# Patient Record
Sex: Female | Born: 1950 | Race: Black or African American | Hispanic: No | Marital: Married | Smoking: Never smoker
Health system: Southern US, Community
[De-identification: ages and names within clinical notes are randomized; demographics above are authoritative.]

## PROBLEM LIST (undated history)

## (undated) DIAGNOSIS — M5030 Other cervical disc degeneration, unspecified cervical region: Secondary | ICD-10-CM

## (undated) DIAGNOSIS — M545 Low back pain, unspecified: Secondary | ICD-10-CM

## (undated) DIAGNOSIS — T4145XA Adverse effect of unspecified anesthetic, initial encounter: Secondary | ICD-10-CM

## (undated) DIAGNOSIS — F419 Anxiety disorder, unspecified: Secondary | ICD-10-CM

## (undated) DIAGNOSIS — T8859XA Other complications of anesthesia, initial encounter: Secondary | ICD-10-CM

## (undated) DIAGNOSIS — R112 Nausea with vomiting, unspecified: Secondary | ICD-10-CM

## (undated) DIAGNOSIS — I10 Essential (primary) hypertension: Secondary | ICD-10-CM

## (undated) DIAGNOSIS — F329 Major depressive disorder, single episode, unspecified: Secondary | ICD-10-CM

## (undated) DIAGNOSIS — M25572 Pain in left ankle and joints of left foot: Secondary | ICD-10-CM

## (undated) DIAGNOSIS — Z9889 Other specified postprocedural states: Secondary | ICD-10-CM

## (undated) DIAGNOSIS — F32A Depression, unspecified: Secondary | ICD-10-CM

## (undated) DIAGNOSIS — M549 Dorsalgia, unspecified: Secondary | ICD-10-CM

## (undated) DIAGNOSIS — M25569 Pain in unspecified knee: Secondary | ICD-10-CM

## (undated) DIAGNOSIS — G629 Polyneuropathy, unspecified: Secondary | ICD-10-CM

## (undated) DIAGNOSIS — G8929 Other chronic pain: Secondary | ICD-10-CM

## (undated) DIAGNOSIS — M79602 Pain in left arm: Secondary | ICD-10-CM

## (undated) DIAGNOSIS — R011 Cardiac murmur, unspecified: Secondary | ICD-10-CM

## (undated) DIAGNOSIS — M1990 Unspecified osteoarthritis, unspecified site: Secondary | ICD-10-CM

## (undated) DIAGNOSIS — M5410 Radiculopathy, site unspecified: Secondary | ICD-10-CM

## (undated) HISTORY — DX: Pain in left arm: M79.602

## (undated) HISTORY — PX: ABDOMINAL HYSTERECTOMY: SHX81

## (undated) HISTORY — DX: Pain in unspecified knee: M25.569

## (undated) HISTORY — DX: Pain in left ankle and joints of left foot: M25.572

## (undated) HISTORY — PX: UTERINE FIBROID SURGERY: SHX826

## (undated) HISTORY — DX: Dorsalgia, unspecified: M54.9

## (undated) HISTORY — PX: OTHER SURGICAL HISTORY: SHX169

## (undated) HISTORY — PX: TONSILECTOMY, ADENOIDECTOMY, BILATERAL MYRINGOTOMY AND TUBES: SHX2538

---

## 1997-09-13 ENCOUNTER — Other Ambulatory Visit: Admission: RE | Admit: 1997-09-13 | Discharge: 1997-09-13 | Payer: Self-pay | Admitting: Obstetrics and Gynecology

## 2001-11-16 ENCOUNTER — Encounter: Payer: Self-pay | Admitting: Family Medicine

## 2001-11-16 ENCOUNTER — Ambulatory Visit (HOSPITAL_COMMUNITY): Admission: RE | Admit: 2001-11-16 | Discharge: 2001-11-16 | Payer: Self-pay | Admitting: Family Medicine

## 2003-04-13 ENCOUNTER — Ambulatory Visit (HOSPITAL_COMMUNITY): Admission: RE | Admit: 2003-04-13 | Discharge: 2003-04-13 | Payer: Self-pay | Admitting: Family Medicine

## 2003-04-19 ENCOUNTER — Ambulatory Visit (HOSPITAL_COMMUNITY): Admission: RE | Admit: 2003-04-19 | Discharge: 2003-04-19 | Payer: Self-pay | Admitting: Family Medicine

## 2003-07-31 ENCOUNTER — Ambulatory Visit (HOSPITAL_COMMUNITY): Admission: RE | Admit: 2003-07-31 | Discharge: 2003-07-31 | Payer: Self-pay | Admitting: Obstetrics and Gynecology

## 2003-12-01 ENCOUNTER — Emergency Department (HOSPITAL_COMMUNITY): Admission: EM | Admit: 2003-12-01 | Discharge: 2003-12-01 | Payer: Self-pay | Admitting: Emergency Medicine

## 2003-12-14 ENCOUNTER — Encounter (HOSPITAL_COMMUNITY): Admission: RE | Admit: 2003-12-14 | Discharge: 2004-01-13 | Payer: Self-pay | Admitting: Orthopedic Surgery

## 2005-04-09 ENCOUNTER — Ambulatory Visit: Payer: Self-pay | Admitting: Orthopedic Surgery

## 2005-08-07 ENCOUNTER — Ambulatory Visit (HOSPITAL_COMMUNITY): Admission: RE | Admit: 2005-08-07 | Discharge: 2005-08-07 | Payer: Self-pay | Admitting: Emergency Medicine

## 2006-07-20 ENCOUNTER — Ambulatory Visit: Payer: Self-pay | Admitting: Orthopedic Surgery

## 2006-09-10 ENCOUNTER — Ambulatory Visit: Payer: Self-pay | Admitting: Orthopedic Surgery

## 2006-09-17 ENCOUNTER — Ambulatory Visit: Payer: Self-pay | Admitting: Orthopedic Surgery

## 2006-09-18 ENCOUNTER — Ambulatory Visit (HOSPITAL_COMMUNITY): Admission: RE | Admit: 2006-09-18 | Discharge: 2006-09-18 | Payer: Self-pay | Admitting: Internal Medicine

## 2008-01-11 ENCOUNTER — Ambulatory Visit (HOSPITAL_COMMUNITY): Admission: RE | Admit: 2008-01-11 | Discharge: 2008-01-11 | Payer: Self-pay | Admitting: Internal Medicine

## 2009-02-27 ENCOUNTER — Ambulatory Visit (HOSPITAL_COMMUNITY): Admission: RE | Admit: 2009-02-27 | Discharge: 2009-02-27 | Payer: Self-pay | Admitting: Internal Medicine

## 2009-08-14 ENCOUNTER — Ambulatory Visit (HOSPITAL_COMMUNITY): Admission: RE | Admit: 2009-08-14 | Discharge: 2009-08-14 | Payer: Self-pay | Admitting: Family Medicine

## 2009-11-14 ENCOUNTER — Ambulatory Visit (HOSPITAL_COMMUNITY): Admission: RE | Admit: 2009-11-14 | Discharge: 2009-11-14 | Payer: Self-pay | Admitting: Family Medicine

## 2009-12-11 ENCOUNTER — Ambulatory Visit (HOSPITAL_COMMUNITY): Admission: RE | Admit: 2009-12-11 | Discharge: 2009-12-11 | Payer: Self-pay | Admitting: Obstetrics and Gynecology

## 2010-01-15 ENCOUNTER — Inpatient Hospital Stay (HOSPITAL_COMMUNITY): Admission: RE | Admit: 2010-01-15 | Discharge: 2010-01-17 | Payer: Self-pay | Admitting: Obstetrics and Gynecology

## 2010-01-15 ENCOUNTER — Encounter: Payer: Self-pay | Admitting: Obstetrics and Gynecology

## 2010-05-29 ENCOUNTER — Other Ambulatory Visit (HOSPITAL_COMMUNITY): Payer: Self-pay | Admitting: Family Medicine

## 2010-05-29 DIAGNOSIS — Z139 Encounter for screening, unspecified: Secondary | ICD-10-CM

## 2010-06-06 ENCOUNTER — Ambulatory Visit (HOSPITAL_COMMUNITY)
Admission: RE | Admit: 2010-06-06 | Discharge: 2010-06-06 | Disposition: A | Discharge status: Home / Self Care | Payer: Medicaid Other | Source: Ambulatory Visit | Attending: Family Medicine | Admitting: Family Medicine

## 2010-06-06 ENCOUNTER — Encounter (HOSPITAL_COMMUNITY): Payer: Self-pay

## 2010-06-06 DIAGNOSIS — Z139 Encounter for screening, unspecified: Secondary | ICD-10-CM

## 2010-06-06 DIAGNOSIS — Z1231 Encounter for screening mammogram for malignant neoplasm of breast: Secondary | ICD-10-CM | POA: Insufficient documentation

## 2010-07-11 ENCOUNTER — Other Ambulatory Visit: Payer: Self-pay | Admitting: Obstetrics and Gynecology

## 2010-07-11 DIAGNOSIS — R1031 Right lower quadrant pain: Secondary | ICD-10-CM

## 2010-07-15 ENCOUNTER — Ambulatory Visit (HOSPITAL_COMMUNITY)
Admission: RE | Admit: 2010-07-15 | Discharge: 2010-07-15 | Disposition: A | Discharge status: Home / Self Care | Payer: Medicaid Other | Source: Ambulatory Visit | Attending: Obstetrics and Gynecology | Admitting: Obstetrics and Gynecology

## 2010-07-15 DIAGNOSIS — R1031 Right lower quadrant pain: Secondary | ICD-10-CM | POA: Insufficient documentation

## 2010-07-15 DIAGNOSIS — N9489 Other specified conditions associated with female genital organs and menstrual cycle: Secondary | ICD-10-CM | POA: Insufficient documentation

## 2010-07-18 LAB — URINALYSIS, ROUTINE W REFLEX MICROSCOPIC
Bilirubin Urine: NEGATIVE
Glucose, UA: NEGATIVE mg/dL
Hgb urine dipstick: NEGATIVE
Ketones, ur: NEGATIVE mg/dL
Nitrite: NEGATIVE
Protein, ur: NEGATIVE mg/dL
Specific Gravity, Urine: 1.015 (ref 1.005–1.030)
Urobilinogen, UA: 0.2 mg/dL (ref 0.0–1.0)
pH: 7.5 (ref 5.0–8.0)

## 2010-07-18 LAB — BASIC METABOLIC PANEL
BUN: 10 mg/dL (ref 6–23)
CO2: 28 mEq/L (ref 19–32)
Calcium: 8.4 mg/dL (ref 8.4–10.5)
Chloride: 101 mEq/L (ref 96–112)
Creatinine, Ser: 1.6 mg/dL — ABNORMAL HIGH (ref 0.4–1.2)
GFR calc Af Amer: 40 mL/min — ABNORMAL LOW (ref >60)
GFR calc non Af Amer: 33 mL/min — ABNORMAL LOW (ref >60)
Glucose, Bld: 117 mg/dL — ABNORMAL HIGH (ref 70–99)
Potassium: 4.7 mEq/L (ref 3.5–5.1)
Sodium: 135 mEq/L (ref 135–145)

## 2010-07-18 LAB — CBC
HCT: 37.4 % (ref 36.0–46.0)
HCT: 39.4 % (ref 36.0–46.0)
Hemoglobin: 12.2 g/dL (ref 12.0–15.0)
Hemoglobin: 13 g/dL (ref 12.0–15.0)
MCH: 27.1 pg (ref 26.0–34.0)
MCH: 27.4 pg (ref 26.0–34.0)
MCHC: 32.5 g/dL (ref 30.0–36.0)
MCHC: 33.1 g/dL (ref 30.0–36.0)
MCV: 82.8 fL (ref 78.0–100.0)
MCV: 83.5 fL (ref 78.0–100.0)
Platelets: 243 10*3/uL (ref 150–400)
Platelets: 277 10*3/uL (ref 150–400)
RBC: 4.48 MIL/uL (ref 3.87–5.11)
RBC: 4.76 MIL/uL (ref 3.87–5.11)
RDW: 13.4 % (ref 11.5–15.5)
RDW: 14.2 % (ref 11.5–15.5)
WBC: 11 10*3/uL — ABNORMAL HIGH (ref 4.0–10.5)
WBC: 7.7 10*3/uL (ref 4.0–10.5)

## 2010-07-18 LAB — COMPREHENSIVE METABOLIC PANEL
ALT: 13 U/L (ref 0–35)
AST: 20 U/L (ref 0–37)
Albumin: 3.4 g/dL — ABNORMAL LOW (ref 3.5–5.2)
Alkaline Phosphatase: 77 U/L (ref 39–117)
BUN: 12 mg/dL (ref 6–23)
CO2: 27 mEq/L (ref 19–32)
Calcium: 8.9 mg/dL (ref 8.4–10.5)
Chloride: 105 mEq/L (ref 96–112)
Creatinine, Ser: 1.05 mg/dL (ref 0.4–1.2)
GFR calc Af Amer: >60 mL/min (ref >60)
GFR calc non Af Amer: 53 mL/min — ABNORMAL LOW (ref >60)
Glucose, Bld: 108 mg/dL — ABNORMAL HIGH (ref 70–99)
Potassium: 3.6 mEq/L (ref 3.5–5.1)
Sodium: 139 mEq/L (ref 135–145)
Total Bilirubin: 0.5 mg/dL (ref 0.3–1.2)
Total Protein: 6.9 g/dL (ref 6.0–8.3)

## 2010-07-18 LAB — SURGICAL PCR SCREEN
MRSA, PCR: NEGATIVE
Staphylococcus aureus: NEGATIVE

## 2010-07-18 LAB — DIFFERENTIAL
Basophils Absolute: 0 10*3/uL (ref 0.0–0.1)
Basophils Relative: 0 % (ref 0–1)
Eosinophils Absolute: 0.1 10*3/uL (ref 0.0–0.7)
Eosinophils Relative: 1 % (ref 0–5)
Lymphocytes Relative: 11 % — ABNORMAL LOW (ref 12–46)
Lymphs Abs: 1.2 10*3/uL (ref 0.7–4.0)
Monocytes Absolute: 0.7 10*3/uL (ref 0.1–1.0)
Monocytes Relative: 7 % (ref 3–12)
Neutro Abs: 9 10*3/uL — ABNORMAL HIGH (ref 1.7–7.7)
Neutrophils Relative %: 81 % — ABNORMAL HIGH (ref 43–77)

## 2010-09-09 ENCOUNTER — Emergency Department (HOSPITAL_COMMUNITY): Payer: Medicaid Other

## 2010-09-09 ENCOUNTER — Emergency Department (HOSPITAL_COMMUNITY)
Admission: EM | Admit: 2010-09-09 | Discharge: 2010-09-10 | Disposition: A | Discharge status: Home / Self Care | Payer: Medicaid Other | Attending: Emergency Medicine | Admitting: Emergency Medicine

## 2010-09-09 DIAGNOSIS — I10 Essential (primary) hypertension: Secondary | ICD-10-CM | POA: Insufficient documentation

## 2010-09-09 DIAGNOSIS — R110 Nausea: Secondary | ICD-10-CM | POA: Insufficient documentation

## 2010-09-09 DIAGNOSIS — M545 Low back pain, unspecified: Secondary | ICD-10-CM | POA: Insufficient documentation

## 2010-09-09 DIAGNOSIS — R42 Dizziness and giddiness: Secondary | ICD-10-CM | POA: Insufficient documentation

## 2010-09-09 LAB — BASIC METABOLIC PANEL
CO2: 27 mEq/L (ref 19–32)
Calcium: 10.2 mg/dL (ref 8.4–10.5)
Chloride: 100 mEq/L (ref 96–112)
Creatinine, Ser: 0.96 mg/dL (ref 0.4–1.2)
GFR calc Af Amer: >60 mL/min (ref >60)
Sodium: 139 mEq/L (ref 135–145)

## 2010-09-09 LAB — CBC
Hemoglobin: 12.8 g/dL (ref 12.0–15.0)
MCHC: 32.5 g/dL (ref 30.0–36.0)
Platelets: 324 10*3/uL (ref 150–400)
RBC: 4.78 MIL/uL (ref 3.87–5.11)

## 2010-09-09 LAB — POCT CARDIAC MARKERS: Troponin i, poc: <0.05 ng/mL (ref 0.00–0.09)

## 2010-09-09 LAB — DIFFERENTIAL
Basophils Absolute: 0.1 10*3/uL (ref 0.0–0.1)
Basophils Relative: 1 % (ref 0–1)
Eosinophils Absolute: 0.1 10*3/uL (ref 0.0–0.7)
Lymphocytes Relative: 30 % (ref 12–46)

## 2010-09-10 LAB — URINALYSIS, ROUTINE W REFLEX MICROSCOPIC
Hgb urine dipstick: NEGATIVE
Nitrite: NEGATIVE
Protein, ur: NEGATIVE mg/dL
Specific Gravity, Urine: >1.03 — ABNORMAL HIGH (ref 1.005–1.030)
Urobilinogen, UA: 0.2 mg/dL (ref 0.0–1.0)

## 2010-09-11 ENCOUNTER — Other Ambulatory Visit (HOSPITAL_COMMUNITY): Payer: Self-pay | Admitting: Family Medicine

## 2010-09-11 ENCOUNTER — Ambulatory Visit (HOSPITAL_COMMUNITY)
Admission: RE | Admit: 2010-09-11 | Discharge: 2010-09-11 | Disposition: A | Discharge status: Home / Self Care | Payer: Medicaid Other | Source: Ambulatory Visit | Attending: Family Medicine | Admitting: Family Medicine

## 2010-09-11 DIAGNOSIS — M1990 Unspecified osteoarthritis, unspecified site: Secondary | ICD-10-CM

## 2010-09-11 DIAGNOSIS — M25569 Pain in unspecified knee: Secondary | ICD-10-CM | POA: Insufficient documentation

## 2010-09-11 DIAGNOSIS — M899 Disorder of bone, unspecified: Secondary | ICD-10-CM | POA: Insufficient documentation

## 2010-09-11 DIAGNOSIS — M949 Disorder of cartilage, unspecified: Secondary | ICD-10-CM | POA: Insufficient documentation

## 2010-10-09 ENCOUNTER — Encounter: Payer: Self-pay | Admitting: Orthopedic Surgery

## 2010-10-09 ENCOUNTER — Ambulatory Visit (INDEPENDENT_AMBULATORY_CARE_PROVIDER_SITE_OTHER): Payer: Medicaid Other | Admitting: Orthopedic Surgery

## 2010-10-09 VITALS — HR 86 | Ht 64.0 in | Wt 269.0 lb

## 2010-10-09 DIAGNOSIS — M1710 Unilateral primary osteoarthritis, unspecified knee: Secondary | ICD-10-CM

## 2010-10-09 MED ORDER — METHYLPREDNISOLONE ACETATE 40 MG/ML IJ SUSP: 40.0000 mg | Freq: Once | INTRAMUSCULAR | Status: DC

## 2010-10-09 MED ORDER — HYDROCODONE-ACETAMINOPHEN 5-325 MG PO TABS
1.0000 | ORAL_TABLET | Freq: Four times a day (QID) | ORAL | Status: AC | PRN
Start: 2010-10-09 — End: 2010-10-19

## 2010-10-09 NOTE — Procedures (Signed)
Injection LEFT knee.  Consent was obtained.  Time out was taken   LEFT knee was injected with Depo-Medrol 40 mg plus lidocaine 1% 4 cc.  Knee was prepped with alcohol and anesthetized with ethyl chloride.  The injection was tolerated without complication.   

## 2010-10-09 NOTE — Patient Instructions (Signed)
You have received a steroid shot. 15% of patients experience increased pain at the injection site with in the next 24 hours. This is best treated with ice and tylenol extra strength 2 tabs every 8 hours. If you are still having pain please call the office.    

## 2010-10-09 NOTE — Progress Notes (Signed)
Consult  Requesting physician Dr. Delbert Harness  Reason for consultation pain LEFT knee  60 year old female history of injury to the LEFT knee back in 2010 presents with throbbing stabbing 8/10 knee pain which comes and goes and seems to be worse at night.  Symptoms are worse when she does activities of the day and seemed to get better when she gets in her out of the vehicle.  She has some numbness in the LEFT hip and LEFT leg which are associated with some hip pain that she's been having.  This is asked over her lumbar area.  Just some knee swelling.  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  No street drugs.  Vital signs are as stated she definitely has obesity  Her overall appearance is normal  Her mood and affect are normal  Her gait and station are unsupported  The LEFT knee is tender over the medial compartment it is large and its circumference.  Range of motion is greater than 90 he seems to come to full extension, the knee remained stable strength is normal skin is intact she has good pulse normal temperature no edema there is no lymphadenopathy sensation is normal.  No pathologic reflexes.  RIGHT knee no swelling alignment is normal range of motion flexion greater than 90.  Stability normal.  Strength normal.  Skin normal.  Pulse and temperature normal no edema.  Lymph nodes negative.  Sensation  normal.  No pathologic reflexes were noted on the RIGHT as well  X-rays are from May of this year she brought copies with her they are over magnify but she does have some mild to moderate medial joint space narrowing.  Nothing really significant.  Diagnosis osteoarthritis LEFT knee  Plan inject LEFT knee 3 with Norco for pain 5 mg.  Would not go above this dose.  I think she will be okay for the next couple years and then will need knee replacement.  It is very important that she try to lose weight because her obesity will put her at increased risk of infection which is catastrophic it with total joint replacement

## 2011-12-30 ENCOUNTER — Other Ambulatory Visit (HOSPITAL_COMMUNITY): Payer: Self-pay | Admitting: Physician Assistant

## 2011-12-30 DIAGNOSIS — Z139 Encounter for screening, unspecified: Secondary | ICD-10-CM

## 2012-01-06 ENCOUNTER — Ambulatory Visit (HOSPITAL_COMMUNITY)
Admission: RE | Admit: 2012-01-06 | Discharge: 2012-01-06 | Disposition: A | Discharge status: Home / Self Care | Payer: Self-pay | Source: Ambulatory Visit | Attending: Physician Assistant | Admitting: Physician Assistant

## 2012-01-06 DIAGNOSIS — Z139 Encounter for screening, unspecified: Secondary | ICD-10-CM

## 2013-02-27 ENCOUNTER — Encounter (HOSPITAL_COMMUNITY): Payer: Self-pay | Admitting: Emergency Medicine

## 2013-02-27 ENCOUNTER — Emergency Department (HOSPITAL_COMMUNITY)
Admission: EM | Admit: 2013-02-27 | Discharge: 2013-02-27 | Disposition: A | Discharge status: Home / Self Care | Payer: PRIVATE HEALTH INSURANCE | Attending: Emergency Medicine | Admitting: Emergency Medicine

## 2013-02-27 DIAGNOSIS — Z79899 Other long term (current) drug therapy: Secondary | ICD-10-CM | POA: Insufficient documentation

## 2013-02-27 DIAGNOSIS — I10 Essential (primary) hypertension: Secondary | ICD-10-CM | POA: Insufficient documentation

## 2013-02-27 DIAGNOSIS — L723 Sebaceous cyst: Secondary | ICD-10-CM | POA: Insufficient documentation

## 2013-02-27 DIAGNOSIS — Z91040 Latex allergy status: Secondary | ICD-10-CM | POA: Insufficient documentation

## 2013-02-27 DIAGNOSIS — L089 Local infection of the skin and subcutaneous tissue, unspecified: Secondary | ICD-10-CM

## 2013-02-27 DIAGNOSIS — Z8739 Personal history of other diseases of the musculoskeletal system and connective tissue: Secondary | ICD-10-CM | POA: Insufficient documentation

## 2013-02-27 HISTORY — DX: Essential (primary) hypertension: I10

## 2013-02-27 MED ORDER — HYDROCODONE-ACETAMINOPHEN 5-325 MG PO TABS: 1.0000 | ORAL_TABLET | ORAL | Status: DC | PRN

## 2013-02-27 MED ORDER — LIDOCAINE HCL (PF) 1 % IJ SOLN
INTRAMUSCULAR | Status: AC
  Administered 2013-02-27: 12:00:00
  Filled 2013-02-27: qty 5

## 2013-02-27 NOTE — ED Notes (Signed)
Pt has mole above right breast that pcp put on 21 days of antibiotics. States it opened today and is draining blood and clear discharge. nad

## 2013-02-28 NOTE — ED Provider Notes (Signed)
CSN: [id number]     Arrival date & time 02/27/13  1056 History   First MD Initiated Contact with Patient 02/27/13 1108     Chief Complaint  Patient presents with  . Wound Check   (Consider location/radiation/quality/duration/timing/severity/associated sxs/prior Treatment) HPI Comments: Abigail Montgomery is a 62 y.o. Female presenting with bloody drainage from a "mole" on her chest which has been there for over a year,  which is being treated by her pcp with bactrim, currently on day 10 of a 21 day course.  She has mild tenderness at the site. She denies fevers or chills, nausea or vomiting.  She denies any other skin infections.     The history is provided by the patient.    Past Medical History  Diagnosis Date  . Back pain   . Knee pain   . Abdominal pain   . Left ankle pain   . Left arm pain   . Hypertension    Past Surgical History  Procedure Laterality Date  . Tonsilectomy, adenoidectomy, bilateral myringotomy and tubes    . Cyst removed from lower abdomen    . Uterine fibroid surgery    . Abdominal hysterectomy     Family History  Problem Relation Age of Onset  . Heart disease    . Arthritis    . Diabetes    . Kidney disease     History  Substance Use Topics  . Smoking status: Never Smoker   . Smokeless tobacco: Not on file  . Alcohol Use: Yes     Comment: occ   OB History   Grav Para Term Preterm Abortions TAB SAB Ect Mult Living                 Review of Systems  Constitutional: Negative for fever and chills.  HENT: Negative for facial swelling.   Respiratory: Negative for shortness of breath and wheezing.   Skin: Positive for wound.  Neurological: Negative for numbness.    Allergies  Latex  Home Medications   Current Outpatient Rx  Name  Route  Sig  Dispense  Refill  . lisinopril-hydrochlorothiazide (PRINZIDE,ZESTORETIC) 20-12.5 MG per tablet   Oral   Take 1 tablet by mouth daily.         Marland Kitchen sulfamethoxazole-trimethoprim (BACTRIM DS)  800-160 MG per tablet   Oral   Take 1 tablet by mouth 2 (two) times daily. For 21 days started on 10/15         . HYDROcodone-acetaminophen (NORCO/VICODIN) 5-325 MG per tablet   Oral   Take 1 tablet by mouth every 4 (four) hours as needed for pain.   15 tablet   0    BP 154/86  Pulse 100  Temp(Src) 99.2 F (37.3 C) (Oral)  Resp 22  SpO2 100% Physical Exam  Constitutional: She is oriented to person, place, and time. She appears well-developed and well-nourished.  HENT:  Head: Normocephalic.  Cardiovascular: Normal rate.   Pulmonary/Chest: Effort normal.  Musculoskeletal: She exhibits tenderness.  Neurological: She is alert and oriented to person, place, and time. No sensory deficit.  Skin:  3 x 4 cm sebaceous cyst with central small open pore,  Drainage purulence and blood with gentle pressure.      ED Course  Procedures (including critical care time)  Discussed warm soaks and continued abx vs I & D today- pt agrees with I & D   INCISION AND DRAINAGE Performed by: Burgess Amor Consent: Verbal consent obtained. Risks and benefits:  risks, benefits and alternatives were discussed Type: abscess  Body area:  Chest wall, upper midline  Anesthesia: local infiltration  Incision was made with a scalpel.  Local anesthetic: lidocaine 1% without epinephrine  Anesthetic total: 3 ml  Complexity: complex Blunt dissection to break up loculations  Drainage: purulent drainage mixed with blood.  Also removed several large pieces of thick sebum  Drainage amount: moderate  Packing material: no packing.  Dressing applied to site  Patient tolerance: Patient tolerated the procedure well with no immediate complications.    Labs Review Labs Reviewed - No data to display Imaging Review No results found.  EKG Interpretation   None       MDM   1. Infected sebaceous cyst    Advised continued warm soaks and abx.  Recommended f/u with her pcp as planned.    Burgess Amor, PA-C 02/28/13 2230

## 2013-03-06 NOTE — ED Provider Notes (Signed)
Medical screening examination/treatment/procedure(s) were performed by non-physician practitioner and as supervising physician I was immediately available for consultation/collaboration.  Hurman Horn, MD 03/06/13 [phone]

## 2013-05-16 ENCOUNTER — Telehealth: Payer: Self-pay | Admitting: Certified Registered Nurse Anesthetist

## 2013-06-06 ENCOUNTER — Other Ambulatory Visit (HOSPITAL_COMMUNITY): Payer: Self-pay | Admitting: Family Medicine

## 2013-06-06 ENCOUNTER — Other Ambulatory Visit (INDEPENDENT_AMBULATORY_CARE_PROVIDER_SITE_OTHER): Payer: Self-pay | Admitting: Internal Medicine

## 2013-06-06 DIAGNOSIS — Z139 Encounter for screening, unspecified: Secondary | ICD-10-CM

## 2013-06-09 ENCOUNTER — Ambulatory Visit (HOSPITAL_COMMUNITY)
Admission: RE | Admit: 2013-06-09 | Discharge: 2013-06-09 | Disposition: A | Discharge status: Home / Self Care | Payer: PRIVATE HEALTH INSURANCE | Source: Ambulatory Visit | Attending: Family Medicine | Admitting: Family Medicine

## 2013-06-09 DIAGNOSIS — Z139 Encounter for screening, unspecified: Secondary | ICD-10-CM

## 2013-06-09 DIAGNOSIS — Z1231 Encounter for screening mammogram for malignant neoplasm of breast: Secondary | ICD-10-CM | POA: Insufficient documentation

## 2013-11-01 ENCOUNTER — Ambulatory Visit (HOSPITAL_COMMUNITY)
Admission: RE | Admit: 2013-11-01 | Discharge: 2013-11-01 | Disposition: A | Discharge status: Home / Self Care | Payer: Disability Insurance | Source: Ambulatory Visit | Attending: Family Medicine | Admitting: Family Medicine

## 2013-11-01 ENCOUNTER — Other Ambulatory Visit (HOSPITAL_COMMUNITY): Payer: Self-pay | Admitting: Family Medicine

## 2013-11-01 DIAGNOSIS — M542 Cervicalgia: Secondary | ICD-10-CM

## 2013-11-01 DIAGNOSIS — M25561 Pain in right knee: Secondary | ICD-10-CM

## 2013-11-01 DIAGNOSIS — M25569 Pain in unspecified knee: Secondary | ICD-10-CM | POA: Insufficient documentation

## 2013-11-11 ENCOUNTER — Encounter (INDEPENDENT_AMBULATORY_CARE_PROVIDER_SITE_OTHER): Payer: Self-pay | Admitting: *Deleted

## 2014-02-14 ENCOUNTER — Encounter (INDEPENDENT_AMBULATORY_CARE_PROVIDER_SITE_OTHER): Payer: Self-pay | Admitting: *Deleted

## 2014-07-03 DIAGNOSIS — I119 Hypertensive heart disease without heart failure: Secondary | ICD-10-CM | POA: Diagnosis not present

## 2014-07-03 DIAGNOSIS — R5382 Chronic fatigue, unspecified: Secondary | ICD-10-CM | POA: Diagnosis not present

## 2014-07-03 DIAGNOSIS — M2550 Pain in unspecified joint: Secondary | ICD-10-CM | POA: Diagnosis not present

## 2014-07-07 DIAGNOSIS — M25561 Pain in right knee: Secondary | ICD-10-CM | POA: Diagnosis not present

## 2014-07-07 DIAGNOSIS — I119 Hypertensive heart disease without heart failure: Secondary | ICD-10-CM | POA: Diagnosis not present

## 2014-07-07 DIAGNOSIS — R5382 Chronic fatigue, unspecified: Secondary | ICD-10-CM | POA: Diagnosis not present

## 2014-07-17 ENCOUNTER — Emergency Department (HOSPITAL_COMMUNITY): Payer: Medicare Other

## 2014-07-17 ENCOUNTER — Emergency Department (HOSPITAL_COMMUNITY)
Admission: EM | Admit: 2014-07-17 | Discharge: 2014-07-17 | Disposition: A | Discharge status: Home / Self Care | Payer: Medicare Other | Attending: Emergency Medicine | Admitting: Emergency Medicine

## 2014-07-17 ENCOUNTER — Encounter (HOSPITAL_COMMUNITY): Payer: Self-pay

## 2014-07-17 DIAGNOSIS — S99921A Unspecified injury of right foot, initial encounter: Secondary | ICD-10-CM | POA: Diagnosis present

## 2014-07-17 DIAGNOSIS — Z91040 Latex allergy status: Secondary | ICD-10-CM | POA: Insufficient documentation

## 2014-07-17 DIAGNOSIS — S90121A Contusion of right lesser toe(s) without damage to nail, initial encounter: Secondary | ICD-10-CM

## 2014-07-17 DIAGNOSIS — Y998 Other external cause status: Secondary | ICD-10-CM | POA: Diagnosis not present

## 2014-07-17 DIAGNOSIS — S90931A Unspecified superficial injury of right great toe, initial encounter: Secondary | ICD-10-CM | POA: Diagnosis not present

## 2014-07-17 DIAGNOSIS — S90111A Contusion of right great toe without damage to nail, initial encounter: Secondary | ICD-10-CM | POA: Insufficient documentation

## 2014-07-17 DIAGNOSIS — Y92003 Bedroom of unspecified non-institutional (private) residence as the place of occurrence of the external cause: Secondary | ICD-10-CM | POA: Insufficient documentation

## 2014-07-17 DIAGNOSIS — Y9301 Activity, walking, marching and hiking: Secondary | ICD-10-CM | POA: Diagnosis not present

## 2014-07-17 DIAGNOSIS — W228XXA Striking against or struck by other objects, initial encounter: Secondary | ICD-10-CM | POA: Insufficient documentation

## 2014-07-17 DIAGNOSIS — Z79899 Other long term (current) drug therapy: Secondary | ICD-10-CM | POA: Diagnosis not present

## 2014-07-17 DIAGNOSIS — I10 Essential (primary) hypertension: Secondary | ICD-10-CM | POA: Diagnosis not present

## 2014-07-17 DIAGNOSIS — Z792 Long term (current) use of antibiotics: Secondary | ICD-10-CM | POA: Diagnosis not present

## 2014-07-17 DIAGNOSIS — M79674 Pain in right toe(s): Secondary | ICD-10-CM | POA: Diagnosis not present

## 2014-07-17 MED ORDER — TRAMADOL HCL 50 MG PO TABS: 50.0000 mg | ORAL_TABLET | Freq: Four times a day (QID) | ORAL | Status: DC | PRN

## 2014-07-17 NOTE — ED Notes (Signed)
Pt reports hit toe on something getting out of the bed.  C/O pain to r great toe.

## 2014-07-17 NOTE — Discharge Instructions (Signed)
Contusion A contusion is a deep bruise. Contusions happen when an injury causes bleeding under the skin. Signs of bruising include pain, puffiness (swelling), and discolored skin. The contusion may turn blue, purple, or yellow. HOME CARE   Put ice on the injured area.  Put ice in a plastic bag.  Place a towel between your skin and the bag.  Leave the ice on for 15-20 minutes, 03-04 times a day.  Only take medicine as told by your doctor.  Rest the injured area.  If possible, raise (elevate) the injured area to lessen puffiness. GET HELP RIGHT AWAY IF:   You have more bruising or puffiness.  You have pain that is getting worse.  Your puffiness or pain is not helped by medicine. MAKE SURE YOU:   Understand these instructions.  Will watch your condition.  Will get help right away if you are not doing well or get worse. Document Released: 10/08/2007 Document Revised: 07/14/2011 Document Reviewed: 02/24/2011 [facility] Patient Information 2015 [employer] [place]. This information is not intended to replace advice given to you by your health care provider. Make sure you discuss any questions you have with your health care provider.   Wear the post op shoe for comfort until your pain is improved.  You may take the tramadol prescribed for pain relief.  This will make you drowsy - do not drive within 4 hours of taking this medication.

## 2014-07-17 NOTE — ED Provider Notes (Signed)
CSN: [id number]     Arrival date & time 07/17/14  1833 History  This chart was scribed for non-physician practitioner, Burgess Amor, PA-C, working with Mancel Bale, MD, by Ronney Lion, ED Scribe. This patient was seen in room APFT24/APFT24 and the patient's care was started at 7:31 PM.    Chief Complaint  Patient presents with  . Toe Pain   The history is provided by the patient. No language interpreter was used.     HPI Comments: [name] is a 64 y.o. female who presents to the Emergency Department complaining of right great toe pain that began after she stubbed her toe on the floor when getting out of bed to go to the restroom last night. She has taken Naprosyn with some relief. Patient sees Dr. Romeo Apple for her knee. Patient does housework for a living, which requires frequent walking. She has NKDA. PCP Dr Janna Arch  Past Medical History  Diagnosis Date  . Back pain   . Knee pain   . Abdominal pain   . Left ankle pain   . Left arm pain   . Hypertension    Past Surgical History  Procedure Laterality Date  . Tonsilectomy, adenoidectomy, bilateral myringotomy and tubes    . Cyst removed from lower abdomen    . Uterine fibroid surgery    . Abdominal hysterectomy     Family History  Problem Relation Age of Onset  . Heart disease    . Arthritis    . Diabetes    . Kidney disease     History  Substance Use Topics  . Smoking status: Never Smoker   . Smokeless tobacco: Not on file  . Alcohol Use: Yes     Comment: occ   OB History    No data available     Review of Systems  Constitutional: Negative for fever.  Musculoskeletal: Positive for arthralgias. Negative for myalgias.  Neurological: Negative for weakness and numbness.      Allergies  Latex  Home Medications   Prior to Admission medications   Medication Sig Start Date End Date Taking? Authorizing Provider  HYDROcodone-acetaminophen (NORCO/VICODIN) 5-325 MG per tablet Take 1 tablet by mouth every 4  (four) hours as needed for pain. 02/27/13   Burgess Amor, PA-C  lisinopril-hydrochlorothiazide (PRINZIDE,ZESTORETIC) 20-12.5 MG per tablet Take 1 tablet by mouth daily.    Historical Provider, MD  sulfamethoxazole-trimethoprim (BACTRIM DS) 800-160 MG per tablet Take 1 tablet by mouth 2 (two) times daily. For 21 days started on 10/15    Historical Provider, MD  traMADol (ULTRAM) 50 MG tablet Take 1 tablet (50 mg total) by mouth every 6 (six) hours as needed. 07/17/14   Burgess Amor, PA-C   BP 154/87 mmHg  Pulse 78  Temp(Src) 98.3 F (36.8 C) (Oral)  Resp 18  Ht  (1.702 m)  Wt 296 lb (134.265 kg)  BMI 46.35 kg/m2  SpO2 98% Physical Exam  Constitutional: She appears well-developed and well-nourished.  HENT:  Head: Atraumatic.  Neck: Normal range of motion.  Cardiovascular:  Pulses equal bilaterally  Musculoskeletal: She exhibits tenderness.  Tender to palpation to right great toe, without obvious deformity or trauma. Normal sensation to palpation. Patient is unable to extend or flex the great toe. Dorsalis pedis pulses intact, and foot is nontender.  Neurological: She is alert. She has normal strength. She displays normal reflexes. No sensory deficit.  Skin: Skin is warm and dry.  Psychiatric: She has a normal mood and affect.  Nursing note and vitals reviewed.   ED Course  Procedures (including critical care time)  DIAGNOSTIC STUDIES: Oxygen Saturation is 98% on room air, normal by my interpretation.    COORDINATION OF CARE: 7:33 PM - Discussed treatment plan with pt at bedside which includes post-op shoe, and applying ice at home, and pt agreed to plan.   Labs Review Labs Reviewed - No data to display  Imaging Review Dg Toe Great Right  07/17/2014   CLINICAL DATA:  Abigail Montgomery to the right great toe today. Pain. Initial encounter.  EXAM: RIGHT GREAT TOE  COMPARISON:  None.  FINDINGS: There is no evidence of fracture or dislocation. There is no evidence of arthropathy or other focal  bone abnormality. Soft tissues are unremarkable.  IMPRESSION: Negative exam.   Electronically Signed   By: Drusilla Kannerhomas  Dalessio M.D.   On: 07/17/2014 19:34     EKG Interpretation None      MDM   Final diagnoses:  Toe contusion, right, initial encounter   Patients labs and/or radiological studies were reviewed and considered during the medical decision making and disposition process.  Results were also discussed with patient. Pt placed in post op shoe for comfort only, advised prn use.  Ice, elevation, tramadol prn for pain relief. F/u with pcp prn for persistent sx.  The patient appears reasonably screened and/or stabilized for discharge and I doubt any other medical condition or other [facility] requiring further screening, evaluation, or treatment in the ED at this time prior to discharge.  I personally performed the services described in this documentation, which was scribed in my presence. The recorded information has been reviewed and is accurate.   Burgess AmorJulie Neco Kling, PA-C 07/18/14 1235  Mancel BaleElliott Wentz, MD 07/19/14 1537

## 2014-08-01 DIAGNOSIS — K432 Incisional hernia without obstruction or gangrene: Secondary | ICD-10-CM | POA: Diagnosis not present

## 2014-08-02 NOTE — H&P (Signed)
  NTS SOAP Note  Vital Signs:  Vitals as of: 08/01/2014: Systolic 191: Diastolic 106: Heart Rate 98: Temp 97.33F: Height 155ft 7in: Weight 295Lbs 0 Ounces: BMI 46.2  BMI : 46.2 kg/m2  Subjective: This 64 year old female presents for of a swelling above the umbilicus.  States she has a hernia there,  and it is getting larger and sore.  No nausea,  vomiting noted.  Is beneath a surgical scar.  Review of Symptoms:  Constitutional:fatigue Head:unremarkable Eyes:unremarkable   Nose/Mouth/Throat:unremarkable Cardiovascular:  unremarkable Respiratory:cough Gastrointestinabdominal pain Genitourinary:frequency joint pain rash Hematolgic/Lymphatic:unremarkable   Allergic/Immunologic:unremarkable   Past Medical History:  Reviewed  Past Medical History  Surgical History: TAH,  exploratory lap Medical Problems: HTN Allergies: nkda Medications: magnesium,  lisinopril   Social History:Reviewed  Social History  Preferred Language: English Race:  Black or African American Ethnicity: Not Hispanic / Latino Age: 64 year Marital Status:  S Alcohol: socially   Smoking Status: Never smoker reviewed on 08/01/2014 Functional Status reviewed on 08/01/2014 ------------------------------------------------ Bathing: Normal Cooking: Normal Dressing: Normal Driving: Normal Eating: Normal Managing Meds: Normal Oral Care: Normal Shopping: Normal Toileting: Normal Transferring: Normal Walking: Normal Cognitive Status reviewed on 08/01/2014 ------------------------------------------------ Attention: Normal Decision Making: Normal Language: Normal Memory: Normal Motor: Normal Perception: Normal Problem Solving: Normal Visual and Spatial: Normal   Family History:Reviewed  Family Health History Mother, Living; Heart disease;  Father, Living; Parkinson's disease;     Objective Information: General:Well appearing, well nourished in no distress. Heart:RRR, no  murmur Lungs:  CTA bilaterally, no wheezes, rhonchi, rales.  Breathing unlabored. Abdomen:Soft, NT/ND, no HSM, no masses.  Reducible supraumbilical swellling,  >6cm in size.  Midline surgical scar present.  Assessment:Incisional hernia  Diagnoses: 553.21  K43.2 Incisional hernia (Incisional hernia without obstruction or gangrene)  Procedures: [id number] - OFFICE OUTPATIENT NEW 30 MINUTES    Plan: Scheduled for incisional herniorrhaphy with mesh on 08/18/14.   Patient Education:Alternative treatments to surgery were discussed with patient (and family).  Risks and benefits  of procedure including bleeding,  infection,  mesh use,  and recurrence of the hernia were fully explained to the patient (and family) who gave informed consent. Patient/family questions were addressed.  Follow-up:Pending Surgery

## 2014-08-08 ENCOUNTER — Other Ambulatory Visit (HOSPITAL_COMMUNITY): Payer: PRIVATE HEALTH INSURANCE

## 2014-08-08 DIAGNOSIS — R5382 Chronic fatigue, unspecified: Secondary | ICD-10-CM | POA: Diagnosis not present

## 2014-08-08 DIAGNOSIS — F329 Major depressive disorder, single episode, unspecified: Secondary | ICD-10-CM | POA: Diagnosis not present

## 2014-08-08 DIAGNOSIS — I119 Hypertensive heart disease without heart failure: Secondary | ICD-10-CM | POA: Diagnosis not present

## 2014-08-15 NOTE — Patient Instructions (Signed)
Abigail Montgomery  08/15/2014   Your procedure is scheduled on:  08/18/2014  [facility] at  725  AM.  Call this number if you have problems the morning of surgery: [phone]   Remember:   Do not eat food or drink liquids after midnight.   Take these medicines the morning of surgery with A SIP OF WATER: hydrocodone (if needed), lisinopril, ultram   Do not wear jewelry, make-up or nail polish.  Do not wear lotions, powders, or perfumes.   Do not shave 48 hours prior to surgery. Men may shave face and neck.  Do not bring valuables to the hospital.  [facility] not responsible for any belongings or valuables.               Contacts, dentures or bridgework may not be worn into surgery.  Leave suitcase in the car. After surgery it may be brought to your room.  For patients admitted to the hospital, discharge time is determined by your treatment team.               Patients discharged the day of surgery will not be allowed to drive home.  Name and phone number of your driver: family  Special Instructions: Shower using CHG 2 nights before surgery and the night before surgery.  If you shower the day of surgery use CHG.  Use special wash - you have one bottle of CHG for all showers.  You should use approximately 1/3 of the bottle for each shower.   Please read over the following fact sheets that you were given: Pain Booklet, Coughing and Deep Breathing, Surgical Site Infection Prevention, Anesthesia Post-op Instructions and Care and Recovery After Surgery Hernia A hernia occurs when an internal organ pushes out through a weak spot in the abdominal wall. Hernias most commonly occur in the groin and around the navel. Hernias often can be pushed back into place (reduced). Most hernias tend to get worse over time. Some abdominal hernias can get stuck in the opening (irreducible or incarcerated hernia) and cannot be reduced. An irreducible abdominal hernia which is tightly squeezed  into the opening is at risk for impaired blood supply (strangulated hernia). A strangulated hernia is a medical emergency. Because of the risk for an irreducible or strangulated hernia, surgery may be recommended to repair a hernia. CAUSES   Heavy lifting.  Prolonged coughing.  Straining to have a bowel movement.  A cut (incision) made during an abdominal surgery. HOME CARE INSTRUCTIONS   Bed rest is not required. You may continue your normal activities.  Avoid lifting more than 10 pounds (4.5 kg) or straining.  Cough gently. If you are a smoker it is best to stop. Even the best hernia repair can break down with the continual strain of coughing. Even if you do not have your hernia repaired, a cough will continue to aggravate the problem.  Do not wear anything tight over your hernia. Do not try to keep it in with an outside bandage or truss. These can damage abdominal contents if they are trapped within the hernia sac.  Eat a normal diet.  Avoid constipation. Straining over long periods of time will increase hernia size and encourage breakdown of repairs. If you cannot do this with diet alone, stool softeners may be used. SEEK IMMEDIATE MEDICAL CARE IF:   You have a fever.  You develop increasing abdominal pain.  You feel nauseous or vomit.  Your  hernia is stuck outside the abdomen, looks discolored, feels hard, or is tender.  You have any changes in your bowel habits or in the hernia that are unusual for you.  You have increased pain or swelling around the hernia.  You cannot push the hernia back in place by applying gentle pressure while lying down. MAKE SURE YOU:   Understand these instructions.  Will watch your condition.  Will get help right away if you are not doing well or get worse. Document Released: 04/21/2005 Document Revised: 07/14/2011 Document Reviewed: 12/09/2007 [facility] Patient Information 2015 [place], [place]. This information is not intended to replace  advice given to you by your health care provider. Make sure you discuss any questions you have with your health care provider. PATIENT INSTRUCTIONS POST-ANESTHESIA  IMMEDIATELY FOLLOWING SURGERY:  Do not drive or operate machinery for the first twenty four hours after surgery.  Do not make any important decisions for twenty four hours after surgery or while taking narcotic pain medications or sedatives.  If you develop intractable nausea and vomiting or a severe headache please notify your doctor immediately.  FOLLOW-UP:  Please make an appointment with your surgeon as instructed. You do not need to follow up with anesthesia unless specifically instructed to do so.  WOUND CARE INSTRUCTIONS (if applicable):  Keep a dry clean dressing on the anesthesia/puncture wound site if there is drainage.  Once the wound has quit draining you may leave it open to air.  Generally you should leave the bandage intact for twenty four hours unless there is drainage.  If the epidural site drains for more than 36-48 hours please call the anesthesia department.  QUESTIONS?:  Please feel free to call your physician or the hospital operator if you have any questions, and they will be happy to assist you.

## 2014-08-16 ENCOUNTER — Encounter (HOSPITAL_COMMUNITY): Payer: Self-pay

## 2014-08-16 ENCOUNTER — Encounter (HOSPITAL_COMMUNITY)
Admission: RE | Admit: 2014-08-16 | Discharge: 2014-08-16 | Disposition: A | Discharge status: Home / Self Care | Payer: Medicare Other | Source: Ambulatory Visit | Attending: General Surgery | Admitting: General Surgery

## 2014-08-16 ENCOUNTER — Other Ambulatory Visit: Payer: Self-pay

## 2014-08-16 DIAGNOSIS — Z6841 Body Mass Index (BMI) 40.0 and over, adult: Secondary | ICD-10-CM | POA: Diagnosis not present

## 2014-08-16 DIAGNOSIS — K430 Incisional hernia with obstruction, without gangrene: Secondary | ICD-10-CM | POA: Diagnosis not present

## 2014-08-16 DIAGNOSIS — I10 Essential (primary) hypertension: Secondary | ICD-10-CM | POA: Diagnosis not present

## 2014-08-16 HISTORY — DX: Adverse effect of unspecified anesthetic, initial encounter: T41.45XA

## 2014-08-16 HISTORY — DX: Nausea with vomiting, unspecified: R11.2

## 2014-08-16 HISTORY — DX: Other specified postprocedural states: Z98.890

## 2014-08-16 HISTORY — DX: Other complications of anesthesia, initial encounter: T88.59XA

## 2014-08-16 LAB — CBC WITH DIFFERENTIAL/PLATELET
Basophils Absolute: 0 10*3/uL (ref 0.0–0.1)
Basophils Relative: 1 % (ref 0–1)
EOS PCT: 2 % (ref 0–5)
Eosinophils Absolute: 0.1 10*3/uL (ref 0.0–0.7)
HCT: 43.5 % (ref 36.0–46.0)
HEMOGLOBIN: 13.8 g/dL (ref 12.0–15.0)
LYMPHS ABS: 1.9 10*3/uL (ref 0.7–4.0)
LYMPHS PCT: 27 % (ref 12–46)
MCH: 27.2 pg (ref 26.0–34.0)
MCHC: 31.7 g/dL (ref 30.0–36.0)
MCV: 85.6 fL (ref 78.0–100.0)
MONO ABS: 0.8 10*3/uL (ref 0.1–1.0)
Monocytes Relative: 11 % (ref 3–12)
NEUTROS ABS: 4.3 10*3/uL (ref 1.7–7.7)
Neutrophils Relative %: 59 % (ref 43–77)
Platelets: 332 10*3/uL (ref 150–400)
RBC: 5.08 MIL/uL (ref 3.87–5.11)
RDW: 13.9 % (ref 11.5–15.5)
WBC: 7.1 10*3/uL (ref 4.0–10.5)

## 2014-08-16 LAB — BASIC METABOLIC PANEL
Anion gap: 11 (ref 5–15)
BUN: 23 mg/dL (ref 6–23)
CO2: 27 mmol/L (ref 19–32)
Calcium: 9.7 mg/dL (ref 8.4–10.5)
Chloride: 102 mmol/L (ref 96–112)
Creatinine, Ser: 1.2 mg/dL — ABNORMAL HIGH (ref 0.50–1.10)
GFR calc non Af Amer: 46 mL/min — ABNORMAL LOW (ref >90)
GFR, EST AFRICAN AMERICAN: 54 mL/min — AB (ref >90)
Glucose, Bld: 111 mg/dL — ABNORMAL HIGH (ref 70–99)
POTASSIUM: 4.4 mmol/L (ref 3.5–5.1)
SODIUM: 140 mmol/L (ref 135–145)

## 2014-08-18 ENCOUNTER — Ambulatory Visit (HOSPITAL_COMMUNITY): Payer: Medicare Other | Admitting: Anesthesiology

## 2014-08-18 ENCOUNTER — Encounter (HOSPITAL_COMMUNITY)
Admission: RE | Disposition: A | Discharge status: Home / Self Care | Payer: Self-pay | Source: Ambulatory Visit | Attending: General Surgery

## 2014-08-18 ENCOUNTER — Encounter (HOSPITAL_COMMUNITY): Payer: Self-pay

## 2014-08-18 ENCOUNTER — Ambulatory Visit (HOSPITAL_COMMUNITY)
Admission: RE | Admit: 2014-08-18 | Discharge: 2014-08-19 | Disposition: A | Discharge status: Home / Self Care | Payer: Medicare Other | Source: Ambulatory Visit | Attending: General Surgery | Admitting: General Surgery

## 2014-08-18 DIAGNOSIS — I10 Essential (primary) hypertension: Secondary | ICD-10-CM | POA: Insufficient documentation

## 2014-08-18 DIAGNOSIS — Z6841 Body Mass Index (BMI) 40.0 and over, adult: Secondary | ICD-10-CM | POA: Insufficient documentation

## 2014-08-18 DIAGNOSIS — K432 Incisional hernia without obstruction or gangrene: Secondary | ICD-10-CM | POA: Diagnosis present

## 2014-08-18 DIAGNOSIS — K430 Incisional hernia with obstruction, without gangrene: Secondary | ICD-10-CM | POA: Insufficient documentation

## 2014-08-18 HISTORY — PX: INCISIONAL HERNIA REPAIR: SHX193

## 2014-08-18 HISTORY — PX: INSERTION OF MESH: SHX5868

## 2014-08-18 SURGERY — REPAIR, HERNIA, INCISIONAL
Anesthesia: General | Site: Abdomen

## 2014-08-18 MED ORDER — FENTANYL CITRATE (PF) 100 MCG/2ML IJ SOLN
INTRAMUSCULAR | Status: AC
  Filled 2014-08-18: qty 2

## 2014-08-18 MED ORDER — HYDROCHLOROTHIAZIDE 12.5 MG PO CAPS
12.5000 mg | ORAL_CAPSULE | Freq: Every day | ORAL | Status: DC
  Administered 2014-08-19: 12.5 mg via ORAL
  Filled 2014-08-18: qty 1

## 2014-08-18 MED ORDER — ROCURONIUM BROMIDE 50 MG/5ML IV SOLN
INTRAVENOUS | Status: AC
  Filled 2014-08-18: qty 1

## 2014-08-18 MED ORDER — MIDAZOLAM HCL 2 MG/2ML IJ SOLN
1.0000 mg | INTRAMUSCULAR | Status: DC | PRN
  Administered 2014-08-18: 2 mg via INTRAVENOUS

## 2014-08-18 MED ORDER — NEOSTIGMINE METHYLSULFATE 10 MG/10ML IV SOLN
INTRAVENOUS | Status: DC | PRN
  Administered 2014-08-18: 3 mg via INTRAVENOUS
  Administered 2014-08-18: 1 mg via INTRAVENOUS

## 2014-08-18 MED ORDER — ONDANSETRON HCL 4 MG/2ML IJ SOLN
INTRAMUSCULAR | Status: AC
  Filled 2014-08-18: qty 2

## 2014-08-18 MED ORDER — LISINOPRIL 10 MG PO TABS
20.0000 mg | ORAL_TABLET | Freq: Every day | ORAL | Status: DC
  Administered 2014-08-19: 20 mg via ORAL
  Filled 2014-08-18: qty 2

## 2014-08-18 MED ORDER — LACTATED RINGERS IV SOLN
INTRAVENOUS | Status: DC
  Administered 2014-08-18: 1000 mL via INTRAVENOUS
  Administered 2014-08-19: 04:00:00 via INTRAVENOUS

## 2014-08-18 MED ORDER — DEXAMETHASONE SODIUM PHOSPHATE 4 MG/ML IJ SOLN
INTRAMUSCULAR | Status: AC
  Filled 2014-08-18: qty 1

## 2014-08-18 MED ORDER — ONDANSETRON HCL 4 MG PO TABS: 4.0000 mg | ORAL_TABLET | Freq: Four times a day (QID) | ORAL | Status: DC | PRN

## 2014-08-18 MED ORDER — ENOXAPARIN SODIUM 40 MG/0.4ML ~~LOC~~ SOLN
40.0000 mg | Freq: Once | SUBCUTANEOUS | Status: AC
  Administered 2014-08-18: 40 mg via SUBCUTANEOUS
  Filled 2014-08-18: qty 0.4

## 2014-08-18 MED ORDER — DEXTROSE 5 % IV SOLN: 3.0000 g | INTRAVENOUS | Status: DC

## 2014-08-18 MED ORDER — LIDOCAINE HCL (PF) 1 % IJ SOLN
INTRAMUSCULAR | Status: AC
  Filled 2014-08-18: qty 5

## 2014-08-18 MED ORDER — GLYCOPYRROLATE 0.2 MG/ML IJ SOLN
INTRAMUSCULAR | Status: DC | PRN
  Administered 2014-08-18: 0.2 mg via INTRAVENOUS
  Administered 2014-08-18: 0.6 mg via INTRAVENOUS

## 2014-08-18 MED ORDER — POVIDONE-IODINE 10 % EX OINT
TOPICAL_OINTMENT | CUTANEOUS | Status: AC
  Filled 2014-08-18: qty 1

## 2014-08-18 MED ORDER — LIDOCAINE HCL (CARDIAC) 10 MG/ML IV SOLN
INTRAVENOUS | Status: DC | PRN
  Administered 2014-08-18: 40 mg via INTRAVENOUS

## 2014-08-18 MED ORDER — EPHEDRINE SULFATE 50 MG/ML IJ SOLN
INTRAMUSCULAR | Status: AC
  Filled 2014-08-18: qty 1

## 2014-08-18 MED ORDER — FENTANYL CITRATE (PF) 100 MCG/2ML IJ SOLN
25.0000 ug | INTRAMUSCULAR | Status: DC | PRN
  Administered 2014-08-18 (×2): 50 ug via INTRAVENOUS
  Filled 2014-08-18 (×2): qty 2

## 2014-08-18 MED ORDER — ENOXAPARIN SODIUM 40 MG/0.4ML ~~LOC~~ SOLN
40.0000 mg | SUBCUTANEOUS | Status: DC
  Administered 2014-08-18 – 2014-08-19 (×2): 40 mg via SUBCUTANEOUS
  Filled 2014-08-18 (×2): qty 0.4

## 2014-08-18 MED ORDER — GLYCOPYRROLATE 0.2 MG/ML IJ SOLN
0.2000 mg | Freq: Once | INTRAMUSCULAR | Status: AC
  Administered 2014-08-18: 0.2 mg via INTRAVENOUS

## 2014-08-18 MED ORDER — ONDANSETRON HCL 4 MG/2ML IJ SOLN: 4.0000 mg | Freq: Four times a day (QID) | INTRAMUSCULAR | Status: DC | PRN

## 2014-08-18 MED ORDER — KETOROLAC TROMETHAMINE 30 MG/ML IJ SOLN
INTRAMUSCULAR | Status: AC
  Filled 2014-08-18: qty 1

## 2014-08-18 MED ORDER — CEFAZOLIN SODIUM-DEXTROSE 2-3 GM-% IV SOLR
2.0000 g | INTRAVENOUS | Status: AC
  Administered 2014-08-18: 2 g via INTRAVENOUS

## 2014-08-18 MED ORDER — MORPHINE SULFATE 2 MG/ML IJ SOLN: 2.0000 mg | INTRAMUSCULAR | Status: DC | PRN

## 2014-08-18 MED ORDER — SCOPOLAMINE 1 MG/3DAYS TD PT72
MEDICATED_PATCH | TRANSDERMAL | Status: AC
  Filled 2014-08-18: qty 1

## 2014-08-18 MED ORDER — PROPOFOL 10 MG/ML IV BOLUS
INTRAVENOUS | Status: DC | PRN
  Administered 2014-08-18: 140 mg via INTRAVENOUS

## 2014-08-18 MED ORDER — CEFAZOLIN SODIUM 1-5 GM-% IV SOLN
1.0000 g | INTRAVENOUS | Status: AC
  Administered 2014-08-18: 1 g via INTRAVENOUS

## 2014-08-18 MED ORDER — ROCURONIUM BROMIDE 100 MG/10ML IV SOLN
INTRAVENOUS | Status: DC | PRN
  Administered 2014-08-18: 25 mg via INTRAVENOUS
  Administered 2014-08-18: 5 mg via INTRAVENOUS

## 2014-08-18 MED ORDER — POVIDONE-IODINE 10 % OINT PACKET
TOPICAL_OINTMENT | CUTANEOUS | Status: DC | PRN
  Administered 2014-08-18: 1 via TOPICAL

## 2014-08-18 MED ORDER — DEXAMETHASONE SODIUM PHOSPHATE 4 MG/ML IJ SOLN
4.0000 mg | Freq: Once | INTRAMUSCULAR | Status: AC
  Administered 2014-08-18: 4 mg via INTRAVENOUS

## 2014-08-18 MED ORDER — PROPOFOL 10 MG/ML IV BOLUS
INTRAVENOUS | Status: AC
  Filled 2014-08-18: qty 20

## 2014-08-18 MED ORDER — GLYCOPYRROLATE 0.2 MG/ML IJ SOLN
INTRAMUSCULAR | Status: AC
  Filled 2014-08-18: qty 1

## 2014-08-18 MED ORDER — MIDAZOLAM HCL 2 MG/2ML IJ SOLN
INTRAMUSCULAR | Status: AC
  Filled 2014-08-18: qty 2

## 2014-08-18 MED ORDER — MEPERIDINE HCL 50 MG/ML IJ SOLN: 6.2500 mg | Freq: Once | INTRAMUSCULAR | Status: DC

## 2014-08-18 MED ORDER — CHLORHEXIDINE GLUCONATE 4 % EX LIQD: 1.0000 "application " | Freq: Once | CUTANEOUS | Status: DC

## 2014-08-18 MED ORDER — HYDROCODONE-ACETAMINOPHEN 5-325 MG PO TABS
1.0000 | ORAL_TABLET | ORAL | Status: DC | PRN
  Filled 2014-08-18: qty 1

## 2014-08-18 MED ORDER — SCOPOLAMINE 1 MG/3DAYS TD PT72
1.0000 | MEDICATED_PATCH | Freq: Once | TRANSDERMAL | Status: DC
  Administered 2014-08-18: 1.5 mg via TRANSDERMAL

## 2014-08-18 MED ORDER — BUPIVACAINE LIPOSOME 1.3 % IJ SUSP
INTRAMUSCULAR | Status: AC
  Filled 2014-08-18: qty 20

## 2014-08-18 MED ORDER — FENTANYL CITRATE (PF) 100 MCG/2ML IJ SOLN
INTRAMUSCULAR | Status: DC | PRN
  Administered 2014-08-18: 25 ug via INTRAVENOUS
  Administered 2014-08-18: 100 ug via INTRAVENOUS

## 2014-08-18 MED ORDER — BUPIVACAINE LIPOSOME 1.3 % IJ SUSP
INTRAMUSCULAR | Status: DC | PRN
  Administered 2014-08-18: 20 mL

## 2014-08-18 MED ORDER — SODIUM CHLORIDE 0.9 % IJ SOLN
INTRAMUSCULAR | Status: AC
  Filled 2014-08-18: qty 10

## 2014-08-18 MED ORDER — ONDANSETRON HCL 4 MG/2ML IJ SOLN
4.0000 mg | Freq: Once | INTRAMUSCULAR | Status: AC
  Administered 2014-08-18: 4 mg via INTRAVENOUS

## 2014-08-18 MED ORDER — ONDANSETRON HCL 4 MG/2ML IJ SOLN: 4.0000 mg | Freq: Once | INTRAMUSCULAR | Status: DC | PRN

## 2014-08-18 MED ORDER — LACTATED RINGERS IV SOLN
INTRAVENOUS | Status: DC
  Administered 2014-08-18: 08:00:00 via INTRAVENOUS

## 2014-08-18 MED ORDER — SODIUM CHLORIDE 0.9 % IR SOLN
Status: DC | PRN
  Administered 2014-08-18: 1000 mL

## 2014-08-18 MED ORDER — KETOROLAC TROMETHAMINE 30 MG/ML IJ SOLN
30.0000 mg | Freq: Once | INTRAMUSCULAR | Status: AC
  Administered 2014-08-18: 30 mg via INTRAVENOUS

## 2014-08-18 MED ORDER — EPHEDRINE SULFATE 50 MG/ML IJ SOLN
INTRAMUSCULAR | Status: DC | PRN
  Administered 2014-08-18 (×3): 5 mg via INTRAVENOUS

## 2014-08-18 MED ORDER — CEFAZOLIN SODIUM-DEXTROSE 2-3 GM-% IV SOLR
INTRAVENOUS | Status: AC
  Filled 2014-08-18: qty 50

## 2014-08-18 MED ORDER — LISINOPRIL-HYDROCHLOROTHIAZIDE 20-12.5 MG PO TABS: 1.0000 | ORAL_TABLET | Freq: Every day | ORAL | Status: DC

## 2014-08-18 MED ORDER — CEFAZOLIN SODIUM 1-5 GM-% IV SOLN
INTRAVENOUS | Status: AC
  Filled 2014-08-18: qty 50

## 2014-08-18 MED ORDER — SEVOFLURANE IN SOLN
RESPIRATORY_TRACT | Status: AC
  Filled 2014-08-18: qty 250

## 2014-08-18 MED ORDER — POLYVINYL ALCOHOL 1.4 % OP SOLN
1.0000 [drp] | Freq: Three times a day (TID) | OPHTHALMIC | Status: DC
  Administered 2014-08-18 (×2): 1 [drp] via OPHTHALMIC
  Filled 2014-08-18 (×2): qty 15

## 2014-08-18 SURGICAL SUPPLY — 41 items
BAG HAMPER (MISCELLANEOUS) ×3 IMPLANT
CHLORAPREP W/TINT 26ML (MISCELLANEOUS) ×3 IMPLANT
CLOTH BEACON ORANGE TIMEOUT ST (SAFETY) ×3 IMPLANT
COVER LIGHT HANDLE STERIS (MISCELLANEOUS) ×6 IMPLANT
DECANTER SPIKE VIAL GLASS SM (MISCELLANEOUS) ×3 IMPLANT
ELECT REM PT RETURN 9FT ADLT (ELECTROSURGICAL) ×3
ELECTRODE REM PT RTRN 9FT ADLT (ELECTROSURGICAL) ×1 IMPLANT
FORMALIN 10 PREFIL 480ML (MISCELLANEOUS) ×3 IMPLANT
GAUZE SPONGE 4X4 12PLY STRL (GAUZE/BANDAGES/DRESSINGS) ×2 IMPLANT
GLOVE BIOGEL PI IND STRL 7.0 (GLOVE) ×3 IMPLANT
GLOVE BIOGEL PI INDICATOR 7.0 (GLOVE) ×6
GLOVE SURG SS PI 7.5 STRL IVOR (GLOVE) ×3 IMPLANT
GOWN STRL REUS W/ TWL XL LVL3 (GOWN DISPOSABLE) ×1 IMPLANT
GOWN STRL REUS W/TWL LRG LVL3 (GOWN DISPOSABLE) ×6 IMPLANT
GOWN STRL REUS W/TWL XL LVL3 (GOWN DISPOSABLE) ×2
INST SET MAJOR GENERAL (KITS) ×3 IMPLANT
KIT ROOM TURNOVER APOR (KITS) ×3 IMPLANT
LIGASURE IMPACT 36 18CM CVD LR (INSTRUMENTS) ×3 IMPLANT
MANIFOLD NEPTUNE II (INSTRUMENTS) ×3 IMPLANT
MESH VENTRALEX ST 1-7/10 CRC S (Mesh General) ×3 IMPLANT
NEEDLE HYPO 25X1 1.5 SAFETY (NEEDLE) ×3 IMPLANT
NS IRRIG 1000ML POUR BTL (IV SOLUTION) ×3 IMPLANT
PACK ABDOMINAL MAJOR (CUSTOM PROCEDURE TRAY) ×3 IMPLANT
PAD ARMBOARD 7.5X6 YLW CONV (MISCELLANEOUS) ×3 IMPLANT
SET BASIN LINEN APH (SET/KITS/TRAYS/PACK) ×3 IMPLANT
SPONGE GAUZE 4X4 12PLY (GAUZE/BANDAGES/DRESSINGS) ×3 IMPLANT
STAPLER VISISTAT (STAPLE) ×3 IMPLANT
SUT ETHIBOND NAB MO 7 #0 18IN (SUTURE) ×3 IMPLANT
SUT NOVA NAB GS-21 1 T12 (SUTURE) IMPLANT
SUT NOVA NAB GS-22 2 2-0 T-19 (SUTURE) IMPLANT
SUT NOVA NAB GS-26 0 60 (SUTURE) ×3 IMPLANT
SUT PROLENE 0 CT 1 CR/8 (SUTURE) IMPLANT
SUT SILK 2 0 (SUTURE)
SUT SILK 2-0 18XBRD TIE 12 (SUTURE) IMPLANT
SUT VIC AB 2-0 CT1 27 (SUTURE)
SUT VIC AB 2-0 CT1 TAPERPNT 27 (SUTURE) IMPLANT
SUT VIC AB 3-0 SH 27 (SUTURE) ×2
SUT VIC AB 3-0 SH 27X BRD (SUTURE) ×1 IMPLANT
SUT VIC AB 4-0 PS2 27 (SUTURE) IMPLANT
SYR 20CC LL (SYRINGE) ×3 IMPLANT
TAPE CLOTH SURG 4X10 WHT LF (GAUZE/BANDAGES/DRESSINGS) ×3 IMPLANT

## 2014-08-18 NOTE — Progress Notes (Signed)
Notified Dr. Lovell SheehanJenkins to see if the patient could eat.  He said the patient could have a regular diet.

## 2014-08-18 NOTE — Interval H&P Note (Signed)
History and Physical Interval Note:  08/18/2014 8:27 AM  Abigail MendGeraldine R Sovine  has presented today for surgery, with the diagnosis of incisional hernia  The various methods of treatment have been discussed with the patient and family. After consideration of risks, benefits and other options for treatment, the patient has consented to  Procedure(s): HERNIA REPAIR INCISIONAL WITH MESH (N/A) as a surgical intervention .  The patient's history has been reviewed, patient examined, no change in status, stable for surgery.  I have reviewed the patient's chart and labs.  Questions were answered to the patient's satisfaction.     Franky MachoJENKINS,Saree Krogh A

## 2014-08-18 NOTE — Anesthesia Postprocedure Evaluation (Signed)
  Anesthesia Post-op Note  Patient: Abigail LaniusGeraldine R Casady  Procedure(s) Performed: Procedure(s): INCISIONAL HERNIORRHAPHY WITH MESH (N/A) INSERTION OF MESH (N/A)  Patient Location: PACU  Anesthesia Type:General  Level of Consciousness: awake, alert  and patient cooperative  Airway and Oxygen Therapy: Patient Spontanous Breathing and Patient connected to nasal cannula oxygen  Post-op Pain: mild  Post-op Assessment: Post-op Vital signs reviewed, Patient's Cardiovascular Status Stable, Respiratory Function Stable and Patent Airway  Post-op Vital Signs: Reviewed and stable  Last Vitals:  Filed Vitals:   08/18/14 1145  BP: 121/46  Pulse: 66  Temp:   Resp: 12    Complications: No apparent anesthesia complications

## 2014-08-18 NOTE — Anesthesia Procedure Notes (Signed)
Procedure Name: Intubation Date/Time: 08/18/2014 9:15 AM Performed by: Franco NonesYATES, Aviela Blundell S Pre-anesthesia Checklist: Patient identified, Patient being monitored, Timeout performed, Emergency Drugs available and Suction available Patient Re-evaluated:Patient Re-evaluated prior to inductionOxygen Delivery Method: Circle System Utilized Preoxygenation: Pre-oxygenation with 100% oxygen Intubation Type: IV induction Ventilation: Mask ventilation without difficulty and Oral airway inserted - appropriate to patient size Laryngoscope Size: Hyacinth MeekerMiller and 2 Grade View: Grade I Tube type: Oral Tube size: 7.0 mm Number of attempts: 1 Airway Equipment and Method: Stylet Placement Confirmation: ETT inserted through vocal cords under direct vision,  positive ETCO2 and breath sounds checked- equal and bilateral Secured at: 21 cm Tube secured with: Tape Dental Injury: Teeth and Oropharynx as per pre-operative assessment

## 2014-08-18 NOTE — Op Note (Signed)
Patient:  Abigail Montgomery  DOB:  19-Oct-1950  MRN:  [id number]   Preop Diagnosis:  Incisional hernia  Postop Diagnosis:  Same  Procedure:  Incisional herniorrhaphy with mesh  Surgeon:  Franky MachoMark Quandarius Nill, M.D.  Anes:  Gen. endotracheal  Indications:  Patient is a 64 year old black female status post abdominal surgery midline incision in the past who now presents with pain and swelling superior to the umbilicus and) approximately to the surgical scar. The risks and benefits of the procedure including bleeding, infection, mesh use, and the possibility of recurrence the hernia were fully explained to the patient, who gave informed consent.  Procedure note:  The patient was placed in the supine position. After induction of general endotracheal anesthesia, the abdomen was prepped and draped using usual sterile technique with DuraPrep. Surgical site confirmation was 1.  A supraumbilical incision was made through the previous surgical scar. This was taken down to the fascia. The fascia in this area was fully inspected. Just to the left of the midline, a 1.5-2 cm fascial defect was noted with a significant amount of omentum incarcerated throughout. No bowel was within this. The excess omentum was divided along with the hernia sac using the LigaSure. The rest of the contents were then reduced. No other hernia defect was noted in this region. A Bard 4.3 cm ventralax ST patch was then inserted and secured to the fascia using 0 Ethibond interrupted sutures. The overlying fascia was reapproximated using an 0 Ethibond interrupted suture in a transverse fashion. The subcutaneous layer was reapproximated using a 3-0 Vicryl interrupted suture. Exparel was instilled into the surrounding wound. The skin incisions closed using staples. Betadine ointment and dry sterile dressings were applied.  All tape and needle counts were correct at the end of the procedure. The patient was extubated in the operating room and  transferred to PACU in stable condition.  Complications:  None  EBL:  Minimal  Specimen:  None

## 2014-08-18 NOTE — Transfer of Care (Signed)
Immediate Anesthesia Transfer of Care Note  Patient: Abigail Montgomery  Procedure(s) Performed: Procedure(s) (LRB): INCISIONAL HERNIORRHAPHY WITH MESH (N/A) INSERTION OF MESH (N/A)  Patient Location: PACU  Anesthesia Type: General  Level of Consciousness: awake  Airway & Oxygen Therapy: Patient Spontanous Breathing and non-rebreather face mask  Post-op Assessment: Report given to PACU RN, Post -op Vital signs reviewed and stable and Patient moving all extremities  Post vital signs: Reviewed and stable  Complications: No apparent anesthesia complications

## 2014-08-18 NOTE — Anesthesia Preprocedure Evaluation (Signed)
Anesthesia Evaluation  Patient identified by MRN, date of birth, ID band Patient awake    Reviewed: Allergy & Precautions, NPO status , Patient's Chart, lab work & pertinent test results  History of Anesthesia Complications (+) PONV and history of anesthetic complications  Airway Mallampati: II  TM Distance: >3 FB     Dental  (+) Teeth Intact, Missing   Pulmonary neg pulmonary ROS,  Seasonal allergies  breath sounds clear to auscultation        Cardiovascular hypertension, Pt. on medications Rhythm:Regular Rate:Normal     Neuro/Psych    GI/Hepatic negative GI ROS,   Endo/Other  Morbid obesity  Renal/GU      Musculoskeletal  (+) Arthritis -,   Abdominal   Peds  Hematology   Anesthesia Other Findings   Reproductive/Obstetrics                             Anesthesia Physical Anesthesia Plan  ASA: II  Anesthesia Plan: General   Post-op Pain Management:    Induction: Intravenous  Airway Management Planned: Oral ETT  Additional Equipment:   Intra-op Plan:   Post-operative Plan: Extubation in OR  Informed Consent: I have reviewed the patients History and Physical, chart, labs and discussed the procedure including the risks, benefits and alternatives for the proposed anesthesia with the patient or authorized representative who has indicated his/her understanding and acceptance.     Plan Discussed with:   Anesthesia Plan Comments:         Anesthesia Quick Evaluation

## 2014-08-19 DIAGNOSIS — I10 Essential (primary) hypertension: Secondary | ICD-10-CM | POA: Diagnosis not present

## 2014-08-19 DIAGNOSIS — Z6841 Body Mass Index (BMI) 40.0 and over, adult: Secondary | ICD-10-CM | POA: Diagnosis not present

## 2014-08-19 DIAGNOSIS — K430 Incisional hernia with obstruction, without gangrene: Secondary | ICD-10-CM | POA: Diagnosis not present

## 2014-08-19 MED ORDER — TRAMADOL HCL 50 MG PO TABS: 50.0000 mg | ORAL_TABLET | Freq: Four times a day (QID) | ORAL | Status: DC | PRN

## 2014-08-19 NOTE — Discharge Instructions (Signed)
Ventral Hernia A ventral hernia (also called an incisional hernia) is a hernia that occurs at the site of a previous surgical cut (incision) in the abdomen. The abdominal wall spans from your lower chest down to your pelvis. If the abdominal wall is weakened from a surgical incision, a hernia can occur. A hernia is a bulge of bowel or muscle tissue pushing out on the weakened part of the abdominal wall. Ventral hernias can get bigger from straining or lifting. Obese and older people are at higher risk for a ventral hernia. People who develop infections after surgery or require repeat incisions at the same site on the abdomen are also at increased risk. CAUSES  A ventral hernia occurs because of weakness in the abdominal wall at an incision site.  SYMPTOMS  Common symptoms include: A visible bulge or lump on the abdominal wall. Pain or tenderness around the lump. Increased discomfort if you cough or make a sudden movement. If the hernia has blocked part of the intestine, a serious complication can occur (incarcerated or strangulated hernia). This can become a problem that requires emergency surgery because the blood flow to the blocked intestine may be cut off. Symptoms may include: Feeling sick to your stomach (nauseous). Throwing up (vomiting). Stomach swelling (distention) or bloating. Fever. Rapid heartbeat. DIAGNOSIS  Your health care provider will take a medical history and perform a physical exam. Various tests may be ordered, such as: Blood tests. Urine tests. Ultrasonography. X-rays. Computed tomography (CT). TREATMENT  Watchful waiting may be all that is needed for a smaller hernia that does not cause symptoms. Your health care provider may recommend the use of a supportive belt (truss) that helps to keep the abdominal wall intact. For larger hernias or those that cause pain, surgery to repair the hernia is usually recommended. If a hernia becomes strangulated, emergency surgery  needs to be done right away. HOME CARE INSTRUCTIONS Avoid putting pressure or strain on the abdominal area. Avoid heavy lifting. Use good body positioning for physical tasks. Ask your health care provider about proper body positioning. Use a supportive belt as directed by your health care provider. Maintain a healthy weight. Eat foods that are high in fiber, such as whole grains, fruits, and vegetables. Fiber helps prevent difficult bowel movements (constipation). Drink enough fluids to keep your urine clear or pale yellow. Follow up with your health care provider as directed. SEEK MEDICAL CARE IF:  Your hernia seems to be getting larger or more painful. SEEK IMMEDIATE MEDICAL CARE IF:  You have abdominal pain that is sudden and sharp. Your pain becomes severe. You have repeated vomiting. You are sweating a lot. You notice a rapid heartbeat. You develop a fever. MAKE SURE YOU:  Understand these instructions. Will watch your condition. Will get help right away if you are not doing well or get worse. Document Released: 04/07/2012 Document Revised: 09/05/2013 Document Reviewed: 04/07/2012 [facility] Patient Information 2015 [place], [place]. This information is not intended to replace advice given to you by your health care provider. Make sure you discuss any questions you have with your health care provider. Open Hernia Repair, Care After Refer to this sheet in the next few weeks. These instructions provide you with information on caring for yourself after your procedure. Your health care provider may also give you more specific instructions. Your treatment has been planned according to current medical practices, but problems sometimes occur. Call your health care provider if you have any problems or questions after your procedure.  WHAT TO EXPECT AFTER THE PROCEDURE After your procedure, it is typical to have the following:  Pain in your abdomen, especially along your incision. You will be  given pain medicines to control the pain.  Constipation. You may be given a stool softener to help prevent this. HOME CARE INSTRUCTIONS   Only take over-the-counter or prescription medicines as directed by your health care provider.  Keep the wound dry and clean. You may wash the wound gently with soap and water 48 hours after surgery. Gently blot or dab the wound dry. Do not take baths, use swimming pools, or use hot tubs for 10 days or until your health care provider approves.  Change bandages (dressings) as directed by your health care provider.  Continue your normal diet as directed by your health care provider. Eat plenty of fruits and vegetables to help prevent constipation.  Drink enough fluids to keep your urine clear or pale yellow. This also helps prevent constipation.  Do not drive until your health care provider says it is okay.  Do not lift anything heavier than 10 pounds (4.5 kg) or play contact sports for 4 weeks or until your health care provider approves.  Follow up with your health care provider as directed. Ask your health care provider when to make an appointment to have your stitches (sutures) or staples removed. SEEK MEDICAL CARE IF:   You have increased bleeding coming from the incision site.  You have blood in your stool.  You have increasing pain in the wound.  You see redness or swelling in the wound.  You have fluid (pus) coming from the wound.  You have a fever.  You notice a bad smell coming from the wound or dressing. SEEK IMMEDIATE MEDICAL CARE IF:   You develop a rash.  You have chest pain or shortness of breath.  You feel lightheaded or feel faint. Document Released: 11/08/2004 Document Revised: 02/09/2013 Document Reviewed: 12/01/2012 [facility] Patient Information 2015 [place], [place]. This information is not intended to replace advice given to you by your health care provider. Make sure you discuss any questions you have with your health  care provider.

## 2014-08-19 NOTE — Progress Notes (Signed)
Abigail Montgomery discharged home with husband per MD order.  Discharge instructions reviewed and discussed with the patient, all questions and concerns answered. Copy of instructions and scripts given to patient.    Medication List    TAKE these medications        HYDROcodone-acetaminophen 5-325 MG per tablet  Commonly known as:  NORCO/VICODIN  Take 1 tablet by mouth every 4 (four) hours as needed for pain.     lisinopril-hydrochlorothiazide 20-12.5 MG per tablet  Commonly known as:  PRINZIDE,ZESTORETIC  Take 1 tablet by mouth daily.     Magnesium 250 MG Tabs  Take 250 mg by mouth daily.     naproxen 500 MG tablet  Commonly known as:  NAPROSYN  Take 500 mg by mouth 2 (two) times daily with a meal.     polyvinyl alcohol-povidone 1.4-0.6 % ophthalmic solution  Commonly known as:  HYPOTEARS  Place 1 drop into both eyes 3 (three) times daily.     traMADol 50 MG tablet  Commonly known as:  ULTRAM  Take 1 tablet (50 mg total) by mouth every 6 (six) hours as needed.     vitamin C 1000 MG tablet  Take 1,000 mg by mouth daily as needed (patients takes if she feels like she is coming down with a cold.).     Vitamin D (Cholecalciferol) 400 UNITS Caps  Take 1,600 Units by mouth daily.        Patients surgical incision was clean, dry, and intact upon discharge. IV site discontinued and catheter remains intact. Site without signs and symptoms of complications. Dressing and pressure applied.  Patient escorted to car by IxoniaBonita, NT in a wheelchair,  no distress noted upon discharge.  Ubaldo GlassingJames, Colby Reels Morgan 08/19/2014 12:54 PM

## 2014-08-19 NOTE — Progress Notes (Signed)
Patient sat in chair tonight for 2 hours with no complications. Will continue to monitor.

## 2014-08-19 NOTE — Discharge Summary (Signed)
Physician Discharge Summary  Patient ID: Abigail Montgomery MRN: [id number] DOB/AGE: [birth year] 64 y.o.  Admit date: 08/18/2014 Discharge date: 08/19/2014  Admission Diagnoses: Incisional hernia  Discharge Diagnoses: Same Active Problems:   Incisional hernia   Discharged Condition: good  Hospital Course: Patient is a 64 year old black female who underwent an incisional herniorrhaphy with mesh on 08/18/2014. Her postoperative course has been unremarkable. Her diet was advanced without difficulty. She is being discharged home on 08/19/2014 good improving condition.  Treatments: surgery: Incisional herniorrhaphy with mesh on 08/18/2014  Discharge Exam: Blood pressure 117/46, pulse 79, temperature 97.5 F (36.4 C), temperature source Oral, resp. rate 20, height 5\' 7"  (1.702 m), weight 131.7 kg (290 lb 5.5 oz), SpO2 97 %. General appearance: alert, cooperative and no distress Resp: clear to auscultation bilaterally Cardio: regular rate and rhythm, S1, S2 normal, no murmur, click, rub or gallop GI: Soft, incision healing well.  Disposition: 01-Home or Self Care     Medication List    TAKE these medications        HYDROcodone-acetaminophen 5-325 MG per tablet  Commonly known as:  NORCO/VICODIN  Take 1 tablet by mouth every 4 (four) hours as needed for pain.     lisinopril-hydrochlorothiazide 20-12.5 MG per tablet  Commonly known as:  PRINZIDE,ZESTORETIC  Take 1 tablet by mouth daily.     Magnesium 250 MG Tabs  Take 250 mg by mouth daily.     naproxen 500 MG tablet  Commonly known as:  NAPROSYN  Take 500 mg by mouth 2 (two) times daily with a meal.     polyvinyl alcohol-povidone 1.4-0.6 % ophthalmic solution  Commonly known as:  HYPOTEARS  Place 1 drop into both eyes 3 (three) times daily.     traMADol 50 MG tablet  Commonly known as:  ULTRAM  Take 1 tablet (50 mg total) by mouth every 6 (six) hours as needed.     vitamin C 1000 MG tablet  Take 1,000 mg by mouth daily  as needed (patients takes if she feels like she is coming down with a cold.).     Vitamin D (Cholecalciferol) 400 UNITS Caps  Take 1,600 Units by mouth daily.           Follow-up Information    Follow up with Dalia HeadingJENKINS,Shawndell Schillaci A, MD. Schedule an appointment as soon as possible for a visit on 08/29/2014.   Specialty:  General Surgery   Contact information:   [address] [place] [id number] [phone]       Signed: Franky MachoJENKINS,Glennette Galster A 08/19/2014, 10:38 AM

## 2014-08-21 ENCOUNTER — Encounter (HOSPITAL_COMMUNITY): Payer: Self-pay | Admitting: General Surgery

## 2014-08-22 ENCOUNTER — Encounter (HOSPITAL_COMMUNITY): Payer: Self-pay | Admitting: General Surgery

## 2014-08-25 NOTE — Care Management Utilization Note (Signed)
UR completed 

## 2014-09-21 ENCOUNTER — Other Ambulatory Visit (HOSPITAL_COMMUNITY): Payer: Self-pay | Admitting: Family Medicine

## 2014-09-21 DIAGNOSIS — Z1231 Encounter for screening mammogram for malignant neoplasm of breast: Secondary | ICD-10-CM

## 2014-10-05 ENCOUNTER — Ambulatory Visit (HOSPITAL_COMMUNITY)
Admission: RE | Admit: 2014-10-05 | Discharge: 2014-10-05 | Disposition: A | Discharge status: Home / Self Care | Payer: Medicare Other | Source: Ambulatory Visit | Attending: Family Medicine | Admitting: Family Medicine

## 2014-10-05 DIAGNOSIS — Z1231 Encounter for screening mammogram for malignant neoplasm of breast: Secondary | ICD-10-CM | POA: Insufficient documentation

## 2014-10-13 ENCOUNTER — Ambulatory Visit (HOSPITAL_COMMUNITY)
Admission: RE | Admit: 2014-10-13 | Discharge: 2014-10-13 | Disposition: A | Discharge status: Home / Self Care | Payer: Medicare Other | Source: Ambulatory Visit | Attending: Family Medicine | Admitting: Family Medicine

## 2014-10-13 ENCOUNTER — Other Ambulatory Visit (HOSPITAL_COMMUNITY): Payer: Self-pay | Admitting: Family Medicine

## 2014-10-13 DIAGNOSIS — S8991XA Unspecified injury of right lower leg, initial encounter: Secondary | ICD-10-CM | POA: Diagnosis not present

## 2014-10-13 DIAGNOSIS — M2550 Pain in unspecified joint: Secondary | ICD-10-CM | POA: Diagnosis not present

## 2014-10-13 DIAGNOSIS — R5382 Chronic fatigue, unspecified: Secondary | ICD-10-CM | POA: Diagnosis not present

## 2014-10-13 DIAGNOSIS — I119 Hypertensive heart disease without heart failure: Secondary | ICD-10-CM | POA: Diagnosis not present

## 2014-10-13 DIAGNOSIS — M158 Other polyosteoarthritis: Secondary | ICD-10-CM | POA: Diagnosis not present

## 2014-10-13 DIAGNOSIS — J309 Allergic rhinitis, unspecified: Secondary | ICD-10-CM | POA: Diagnosis not present

## 2014-10-13 DIAGNOSIS — M1712 Unilateral primary osteoarthritis, left knee: Secondary | ICD-10-CM | POA: Diagnosis not present

## 2014-10-26 ENCOUNTER — Ambulatory Visit (INDEPENDENT_AMBULATORY_CARE_PROVIDER_SITE_OTHER): Payer: Medicare Other | Admitting: Orthopedic Surgery

## 2014-10-26 ENCOUNTER — Encounter: Payer: Self-pay | Admitting: Orthopedic Surgery

## 2014-10-26 VITALS — BP 137/83 | Ht 64.0 in | Wt 283.0 lb

## 2014-10-26 DIAGNOSIS — M5410 Radiculopathy, site unspecified: Secondary | ICD-10-CM

## 2014-10-26 MED ORDER — GABAPENTIN 100 MG PO CAPS: 100.0000 mg | ORAL_CAPSULE | Freq: Three times a day (TID) | ORAL | Status: DC

## 2014-10-26 NOTE — Progress Notes (Signed)
Patient ID: Abigail Montgomery, female   DOB: Jul 14, 1950, 64 y.o.   MRN: [id number]  Chief Complaint  Patient presents with  . Knee Pain    bilateral knee pain, Left>Right, ref. [place]    HPI Abigail Montgomery is a 64 y.o. female.  Presents really with radicular pain in her left leg which started on April 16 and was unrelieved by oxycodone and naproxen which she takes for her knee. She did increase her walking to try to lose weight but comes in with pain described as aching like a toothache in the left leg radiating from the left hip. She denies back pain. She does report bilateral feet numbness no diabetes. She's had that for years.  Other symptoms her pain is worse with activity she rates it 8 out of 10 she had knee x-rays which show chronic 3 compartment arthritis  Pertinent review of systems she does report some weight loss from exercise some fatigue anxiety depression the numbness as described the weakness in the legs as well.   Past Medical History  Diagnosis Date  . Back pain   . Knee pain   . Abdominal pain   . Left ankle pain   . Left arm pain   . Hypertension   . Complication of anesthesia   . PONV (postoperative nausea and vomiting)     Past Surgical History  Procedure Laterality Date  . Tonsilectomy, adenoidectomy, bilateral myringotomy and tubes    . Cyst removed from lower abdomen    . Uterine fibroid surgery    . Abdominal hysterectomy    . Incisional hernia repair N/A 08/18/2014    Procedure: Sherald Hess HERNIORRHAPHY WITH MESH;  Surgeon: Franky Macho Md, MD;  Location: [facility];  Service: General;  Laterality: N/A;  . Insertion of mesh N/A 08/18/2014    Procedure: INSERTION OF MESH;  Surgeon: Franky Macho Md, MD;  Location: [facility];  Service: General;  Laterality: N/A;     Allergies  Allergen Reactions  . Latex Rash    Current Outpatient Prescriptions  Medication Sig Dispense Refill  . Ascorbic Acid (VITAMIN C) 1000 MG tablet Take 1,000 mg by mouth daily as  needed (patients takes if she feels like she is coming down with a cold.).    Marland Kitchen gabapentin (NEURONTIN) 100 MG capsule Take 1 capsule (100 mg total) by mouth 3 (three) times daily. 90 capsule 1  . HYDROcodone-acetaminophen (NORCO/VICODIN) 5-325 MG per tablet Take 1 tablet by mouth every 4 (four) hours as needed for pain. (Patient not taking: Reported on 08/08/2014) 15 tablet 0  . lisinopril-hydrochlorothiazide (PRINZIDE,ZESTORETIC) 20-12.5 MG per tablet Take 1 tablet by mouth daily.    . Magnesium 250 MG TABS Take 250 mg by mouth daily.    . naproxen (NAPROSYN) 500 MG tablet Take 500 mg by mouth 2 (two) times daily with a meal.    . polyvinyl alcohol-povidone (HYPOTEARS) 1.4-0.6 % ophthalmic solution Place 1 drop into both eyes 3 (three) times daily.    . traMADol (ULTRAM) 50 MG tablet Take 1 tablet (50 mg total) by mouth every 6 (six) hours as needed. 50 tablet 0  . Vitamin D, Cholecalciferol, 400 UNITS CAPS Take 1,600 Units by mouth daily.     Current Facility-Administered Medications  Medication Dose Route Frequency Provider Last Rate Last Dose  . methylPREDNISolone acetate (DEPO-MEDROL) injection 40 mg  40 mg Intra-articular Once Vickki Hearing, MD        Review of Systems Review of Systems  As  above  Physical Exam Blood pressure 137/83, height  (1.626 m), weight 283 lb (128.368 kg).  Physical Exam Vital signs are stable she is awake alert and oriented 3 body habitus is endomorphic she's pleasant. She amylase with no assistive device  She has tenderness in her lumbar spine. Hip range of motion was normal. Her hip joint is stable knee joint is stable. She has normal motor function the left leg scans intact she had normal pinwheel sensation she had a good pulse. Data Reviewed I did review her knee x-rays and they show severe arthritis but that's chronic and unrelated to this particular problem  Assessment    Radicular pain left leg. She is starting to have some back issues  most likely has a disc problem at L4-5 most likely degenerative as her neurovascular exam is intact. Her long-standing numbness in her legs is probably related to some spinal issue as she does not have diabetes and no medical problems that would cause that. She may have some peripheral neuropathy unrelated to diabetes however.    Plan    We will treat her medically since she has no surgical indications at this point if she does not improve we can certainly take x-rays of her back and proceed after that with MRI if needed  Gabapentin 100 mg 3 times a day for 6 weeks and repeat evaluation       Fuller Canada 10/26/2014, 9:22 AM

## 2014-10-30 DIAGNOSIS — L723 Sebaceous cyst: Secondary | ICD-10-CM | POA: Diagnosis not present

## 2014-10-30 DIAGNOSIS — L918 Other hypertrophic disorders of the skin: Secondary | ICD-10-CM | POA: Diagnosis not present

## 2014-11-01 ENCOUNTER — Telehealth: Payer: Self-pay | Admitting: *Deleted

## 2014-11-01 NOTE — Telephone Encounter (Signed)
Patient was in office last Monday and prescribed Gabapentin. States she has been taking as prescribed three times daily with no results. States she feels as though she is getting worse. Was advised to follow up in six weeks. Do you have any further recommendations?

## 2014-11-02 ENCOUNTER — Other Ambulatory Visit: Payer: Self-pay | Admitting: *Deleted

## 2014-11-02 MED ORDER — PREDNISONE 10 MG (48) PO TBPK: ORAL_TABLET | ORAL | Status: DC

## 2014-11-02 NOTE — Telephone Encounter (Signed)
Patient aware.

## 2014-11-02 NOTE — Telephone Encounter (Signed)
Medication sent to pharmacy, called patient, no answer, left vm 

## 2014-11-02 NOTE — Telephone Encounter (Signed)
Prescribe 12 day dose pack;  10 mg

## 2014-11-04 ENCOUNTER — Encounter (HOSPITAL_COMMUNITY): Payer: Self-pay | Admitting: Emergency Medicine

## 2014-11-04 ENCOUNTER — Emergency Department (HOSPITAL_COMMUNITY): Payer: Medicare Other

## 2014-11-04 ENCOUNTER — Emergency Department (HOSPITAL_COMMUNITY)
Admission: EM | Admit: 2014-11-04 | Discharge: 2014-11-05 | Disposition: A | Discharge status: Home / Self Care | Payer: Medicare Other | Attending: Emergency Medicine | Admitting: Emergency Medicine

## 2014-11-04 DIAGNOSIS — Z79899 Other long term (current) drug therapy: Secondary | ICD-10-CM | POA: Insufficient documentation

## 2014-11-04 DIAGNOSIS — G8929 Other chronic pain: Secondary | ICD-10-CM | POA: Diagnosis not present

## 2014-11-04 DIAGNOSIS — Z792 Long term (current) use of antibiotics: Secondary | ICD-10-CM | POA: Diagnosis not present

## 2014-11-04 DIAGNOSIS — I10 Essential (primary) hypertension: Secondary | ICD-10-CM | POA: Diagnosis not present

## 2014-11-04 DIAGNOSIS — M5030 Other cervical disc degeneration, unspecified cervical region: Secondary | ICD-10-CM | POA: Insufficient documentation

## 2014-11-04 DIAGNOSIS — E041 Nontoxic single thyroid nodule: Secondary | ICD-10-CM | POA: Diagnosis not present

## 2014-11-04 DIAGNOSIS — Z91040 Latex allergy status: Secondary | ICD-10-CM | POA: Insufficient documentation

## 2014-11-04 DIAGNOSIS — R42 Dizziness and giddiness: Secondary | ICD-10-CM | POA: Insufficient documentation

## 2014-11-04 DIAGNOSIS — M1990 Unspecified osteoarthritis, unspecified site: Secondary | ICD-10-CM | POA: Diagnosis not present

## 2014-11-04 DIAGNOSIS — M5032 Other cervical disc degeneration, mid-cervical region: Secondary | ICD-10-CM | POA: Diagnosis not present

## 2014-11-04 HISTORY — DX: Pain in unspecified knee: M25.569

## 2014-11-04 HISTORY — DX: Low back pain, unspecified: M54.50

## 2014-11-04 HISTORY — DX: Low back pain: M54.5

## 2014-11-04 HISTORY — DX: Polyneuropathy, unspecified: G62.9

## 2014-11-04 HISTORY — DX: Unspecified osteoarthritis, unspecified site: M19.90

## 2014-11-04 HISTORY — DX: Radiculopathy, site unspecified: M54.10

## 2014-11-04 HISTORY — DX: Other cervical disc degeneration, unspecified cervical region: M50.30

## 2014-11-04 HISTORY — DX: Other chronic pain: G89.29

## 2014-11-04 LAB — URINALYSIS, ROUTINE W REFLEX MICROSCOPIC
Bilirubin Urine: NEGATIVE
Glucose, UA: NEGATIVE mg/dL
Hgb urine dipstick: NEGATIVE
Ketones, ur: NEGATIVE mg/dL
Leukocytes, UA: NEGATIVE
NITRITE: NEGATIVE
PH: 6 (ref 5.0–8.0)
Protein, ur: NEGATIVE mg/dL
Specific Gravity, Urine: 1.02 (ref 1.005–1.030)
Urobilinogen, UA: 0.2 mg/dL (ref 0.0–1.0)

## 2014-11-04 LAB — I-STAT CHEM 8, ED
BUN: 25 mg/dL — AB (ref 6–20)
CHLORIDE: 107 mmol/L (ref 101–111)
Calcium, Ion: 1.21 mmol/L (ref 1.13–1.30)
Creatinine, Ser: 1.4 mg/dL — ABNORMAL HIGH (ref 0.44–1.00)
Glucose, Bld: 160 mg/dL — ABNORMAL HIGH (ref 65–99)
HCT: 38 % (ref 36.0–46.0)
Hemoglobin: 12.9 g/dL (ref 12.0–15.0)
POTASSIUM: 4.7 mmol/L (ref 3.5–5.1)
Sodium: 140 mmol/L (ref 135–145)
TCO2: 23 mmol/L (ref 0–100)

## 2014-11-04 MED ORDER — METHOCARBAMOL 500 MG PO TABS: 500.0000 mg | ORAL_TABLET | Freq: Two times a day (BID) | ORAL | Status: DC | PRN

## 2014-11-04 NOTE — ED Provider Notes (Signed)
CSN: [id number]     Arrival date & time 11/04/14  2053 History   First MD Initiated Contact with Patient 11/04/14 2144     Chief Complaint  Patient presents with  . Neck Pain  . Dizziness      HPI Pt was seen at 2150. Per pt, c/o gradual onset and persistence of constant right sided neck "pain" for the past 1 to 2 weeks. Pain worsens with palpation of the area and body position changes. Denies incont/retention of bowel or bladder, no saddle anesthesia, no focal motor weakness, no tingling/numbness in extremities, no fevers, no injury.  Pt also c/o feeling "lightheaded" that began "after sitting outside today." Denies syncope, no vertigo, no headache, no visual changes, no CP/palpitations, no SOB/cough, no abd pain, no N/V/D.     Past Medical History  Diagnosis Date  . Back pain   . Knee pain   . Abdominal pain   . Left ankle pain   . Left arm pain   . Hypertension   . Complication of anesthesia   . PONV (postoperative nausea and vomiting)   . Arthritis   . Chronic knee pain   . Low back pain   . Radiculopathy   . DDD (degenerative disc disease), cervical   . Peripheral neuropathy    Past Surgical History  Procedure Laterality Date  . Tonsilectomy, adenoidectomy, bilateral myringotomy and tubes    . Cyst removed from lower abdomen    . Uterine fibroid surgery    . Abdominal hysterectomy    . Incisional hernia repair N/A 08/18/2014    Procedure: Sherald HessINCISIONAL HERNIORRHAPHY WITH MESH;  Surgeon: Franky MachoMark Jenkins Md, MD;  Location: [facility];  Service: General;  Laterality: N/A;  . Insertion of mesh N/A 08/18/2014    Procedure: INSERTION OF MESH;  Surgeon: Franky MachoMark Jenkins Md, MD;  Location: [facility];  Service: General;  Laterality: N/A;   Family History  Problem Relation Age of Onset  . Heart disease    . Arthritis    . Diabetes    . Kidney disease    . Heart disease Mother   . Parkinson's disease Father    History  Substance Use Topics  . Smoking status: Never Smoker   . Smokeless  tobacco: Never Used  . Alcohol Use: Yes     Comment: occ    Review of Systems ROS: Statement: All systems negative except as marked or noted in the HPI; Constitutional: Negative for fever and chills. ; ; Eyes: Negative for eye pain, redness and discharge. ; ; ENMT: Negative for ear pain, hoarseness, nasal congestion, sinus pressure and sore throat. ; ; Cardiovascular: Negative for chest pain, palpitations, diaphoresis, dyspnea and peripheral edema. ; ; Respiratory: Negative for cough, wheezing and stridor. ; ; Gastrointestinal: Negative for nausea, vomiting, diarrhea, abdominal pain, blood in stool, hematemesis, jaundice and rectal bleeding. ; ; Genitourinary: Negative for dysuria, flank pain and hematuria. ; ; Musculoskeletal: +right sided neck pain. Negative for back pain. Negative for swelling and trauma.; ; Skin: Negative for pruritus, rash, abrasions, blisters, bruising and skin lesion.; ; Neuro: +lightheadedness. Negative for headache and neck stiffness. Negative for weakness, altered level of consciousness , altered mental status, extremity weakness, paresthesias, involuntary movement, seizure and syncope.     Allergies  Latex  Home Medications   Prior to Admission medications   Medication Sig Start Date End Date Taking? Authorizing Provider  albuterol (PROVENTIL HFA;VENTOLIN HFA) 108 (90 BASE) MCG/ACT inhaler Inhale 2 puffs into the lungs every  6 (six) hours as needed for wheezing or shortness of breath.   Yes Historical Provider, MD  carboxymethylcellulose 1 % ophthalmic solution Place 1 drop into both eyes 3 (three) times daily.   Yes Historical Provider, MD  cetirizine (ZYRTEC) 10 MG tablet Take 10 mg by mouth daily.   Yes Historical Provider, MD  cholecalciferol (VITAMIN D) 1000 UNITS tablet Take 4,000 Units by mouth daily.   Yes Historical Provider, MD  doxycycline (VIBRA-TABS) 100 MG tablet Take 100 mg by mouth 2 (two) times daily.   Yes Historical Provider, MD  gabapentin  (NEURONTIN) 100 MG capsule Take 1 capsule (100 mg total) by mouth 3 (three) times daily. 10/26/14  Yes Vickki Hearing, MD  lisinopril-hydrochlorothiazide (PRINZIDE,ZESTORETIC) 20-12.5 MG per tablet Take 1 tablet by mouth daily.   Yes Historical Provider, MD  Magnesium 250 MG TABS Take 250 mg by mouth daily.   Yes Historical Provider, MD  naproxen (NAPROSYN) 500 MG tablet Take 500 mg by mouth 2 (two) times daily as needed for mild pain.    Yes Historical Provider, MD  oxycodone (OXY-IR) 5 MG capsule Take 5 mg by mouth every 4 (four) hours as needed.   Yes Historical Provider, MD  predniSONE (STERAPRED UNI-PAK 48 TAB) 10 MG (48) TBPK tablet Use as directed 11/02/14  Yes Vickki Hearing, MD  Probiotic Product ([employer] ADV PROBIOTIC GUMMIES PO) Take 3 tablets by mouth daily.   Yes Historical Provider, MD  Ascorbic Acid (VITAMIN C) 1000 MG tablet Take 1,000 mg by mouth daily as needed (patients takes if she feels like she is coming down with a cold.).    Historical Provider, MD  HYDROcodone-acetaminophen (NORCO/VICODIN) 5-325 MG per tablet Take 1 tablet by mouth every 4 (four) hours as needed for pain. Patient not taking: Reported on 08/08/2014 02/27/13   Burgess Amor, PA-C  traMADol (ULTRAM) 50 MG tablet Take 1 tablet (50 mg total) by mouth every 6 (six) hours as needed. Patient not taking: Reported on 10/26/2014 08/19/14   Franky Macho, MD  Vitamin D, Cholecalciferol, 400 UNITS CAPS Take 1,600 Units by mouth daily.    Historical Provider, MD   BP 140/81 mmHg  Pulse 64  Temp(Src) 97.9 F (36.6 C) (Oral)  Resp 16  Ht 5\' 4"  (1.626 m)  Wt 286 lb (129.729 kg)  BMI 49.07 kg/m2  SpO2 95%   21:56 Orthostatic Vital Signs KS  Orthostatic Lying  - BP- Lying: 142/69 mmHg ; Pulse- Lying: 72  Orthostatic Sitting - BP- Sitting: 155/88 mmHg ; Pulse- Sitting: 82  Orthostatic Standing at 0 minutes - BP- Standing at 0 minutes: 166/92 mmHg ; Pulse- Standing at 0 minutes: 79      Physical Exam 2155: Physical  examination:  Nursing notes reviewed; Vital signs and O2 SAT reviewed;  Constitutional: Well developed, Well nourished, Well hydrated, In no acute distress; Head:  Normocephalic, atraumatic; Eyes: EOMI, PERRL, No scleral icterus; ENMT: Mouth and pharynx normal, Mucous membranes moist; Neck: Supple, Full range of motion, No lymphadenopathy; Cardiovascular: Regular rate and rhythm, No gallop; Respiratory: Breath sounds clear & equal bilaterally, No wheezes.  Speaking full sentences with ease, Normal respiratory effort/excursion; Chest: Nontender, Movement normal; Abdomen: Soft, Nontender, Nondistended, Normal bowel sounds; Genitourinary: No CVA tenderness; Spine:  No midline CS, TS, LS tenderness. +TTP right hypertonic trapezius muscle. No rash.;; Extremities: Pulses normal, No tenderness, No edema, No calf edema or asymmetry. Right shoulder w/FROM.  NT to palp entire joint, AC joint, clavicle NT, scapula NT, proximal  humerus NT, biceps tendon NT over bicipital groove.  Motor strength at shoulder normal.  Sensation intact over deltoid region, distal NMS intact with right hand having intact and equal sensation and strength in the distribution of the median, radial, and ulnar nerve function compared to opposite side.  Strong radial pulse.  +FROM right elbow with intact motor strength biceps and triceps muscles to resistance.; Neuro: AA&Ox3, Major CN grossly intact.  Speech clear. No gross focal motor or sensory deficits in extremities. Climbs on and off stretcher easily by herself. Gait steady.; Skin: Color normal, Warm, Dry.   ED Course  Procedures     EKG Interpretation None      MDM  MDM Reviewed: previous chart, nursing note and vitals Reviewed previous: CT scan and labs Interpretation: CT scan and labs   Results for orders placed or performed during the hospital encounter of 11/04/14  Urinalysis, Routine w reflex microscopic (not at [facility])  Result Value Ref Range   Color, Urine YELLOW YELLOW    APPearance CLEAR CLEAR   Specific Gravity, Urine 1.020 1.005 - 1.030   pH 6.0 5.0 - 8.0   Glucose, UA NEGATIVE NEGATIVE mg/dL   Hgb urine dipstick NEGATIVE NEGATIVE   Bilirubin Urine NEGATIVE NEGATIVE   Ketones, ur NEGATIVE NEGATIVE mg/dL   Protein, ur NEGATIVE NEGATIVE mg/dL   Urobilinogen, UA 0.2 0.0 - 1.0 mg/dL   Nitrite NEGATIVE NEGATIVE   Leukocytes, UA NEGATIVE NEGATIVE  I-stat Chem 8, ED  Result Value Ref Range   Sodium 140 135 - 145 mmol/L   Potassium 4.7 3.5 - 5.1 mmol/L   Chloride 107 101 - 111 mmol/L   BUN 25 (H) 6 - 20 mg/dL   Creatinine, Ser 1.19 (H) 0.44 - 1.00 mg/dL   Glucose, Bld 147 (H) 65 - 99 mg/dL   Calcium, Ion 8.29 5.62 - 1.30 mmol/L   TCO2 23 0 - 100 mmol/L   Hemoglobin 12.9 12.0 - 15.0 g/dL   HCT 13.0 86.5 - 78.4 %   Ct Cervical Spine Wo Contrast 11/04/2014   CLINICAL DATA:  Right arm numbness for 1 week, and leg swelling. Dizziness. Initial encounter.  EXAM: CT CERVICAL SPINE WITHOUT CONTRAST  TECHNIQUE: Multidetector CT imaging of the cervical spine was performed without intravenous contrast. Multiplanar CT image reconstructions were also generated.  COMPARISON:  Cervical spine radiographs performed 11/01/2013  FINDINGS: There is no evidence of fracture or subluxation. Scattered anterior and posterior osteophytes are seen along the cervical spine, particularly prominent posteriorly. There is mild bony foraminal narrowing on the right at C4-C5 and C5-C6.  Vertebral bodies demonstrate normal height and alignment. Intervertebral disc spaces are preserved. Prevertebral soft tissues are within normal limits. The visualized neural foramina are grossly unremarkable.  A 3.3 cm mildly heterogeneous nodule is noted at the left thyroid lobe. The visualized lung apices are clear. No significant soft tissue abnormalities are seen. The visualized portions of the brain are unremarkable.  IMPRESSION: 1. No evidence of fracture or subluxation along the cervical spine. 2. Mild bony  foraminal narrowing on the right at C4-C5 and C5-C6. Evaluation for impression on exiting nerve roots is limited on CT. If the patient's symptoms persist, MRI could be considered for further evaluation. 3. 3.3 cm mildly heterogeneous nodule at the left thyroid lobe. Consider further evaluation with thyroid ultrasound. If patient is clinically hyperthyroid, consider nuclear medicine thyroid uptake and scan.   Electronically Signed   By: Roanna Raider M.D.   On: 11/04/2014 23:16  Results for ZAFIRO, ROUTSON (MRN [id number]) as of 11/04/2014 23:21  Ref. Range 01/16/2010 09:05 09/09/2010 22:45 08/16/2014 09:50 11/04/2014 22:09  BUN Latest Ref Range: 6-20 mg/dL (H)  Creatinine Latest Ref Range: 0.44-1.00 mg/dL 4.13 (H) 2.44 0.10 (H) 1.40 (H)    2320:  BUN/Cr near baseline. Not orthostatic on VS. Has tol PO well while in the ED without N/V. Has ambulated with steady gait. Denies CP/palpitations/SOB. CT scan with known DDD; tx symptomatically and f/u with PMD. Pt states she feels better and wants to go home now. Pt already has several narcotic pain meds; will not rx any further narcotics. Dx and testing d/w pt and family.  Questions answered.  Verb understanding, agreeable to d/c home with outpt f/u.    Samuel Jester, DO 11/08/14 1042

## 2014-11-04 NOTE — ED Notes (Addendum)
Pt. Reports right arm numbness x1 week. Pt. Denies chest pain at present. Pt. C/o leg swelling. Pt. Reports legs have been swollen "for a while". Pt. Reports dizziness starting today.

## 2014-11-04 NOTE — ED Notes (Signed)
Gave pt gingerale to drink. Pt tolerating well at this time.

## 2014-11-04 NOTE — Discharge Instructions (Signed)
°Emergency Department Resource Guide °1) Find a Doctor and Pay Out of Pocket °Although you won't have to find out who is covered by your insurance plan, it is a good idea to ask around and get recommendations. You will then need to call the office and see if the doctor you have chosen will accept you as a new patient and what types of options they offer for patients who are self-pay. Some doctors offer discounts or will set up payment plans for their patients who do not have insurance, but you will need to ask so you aren't surprised when you get to your appointment. ° °2) Contact Your Local Health Department °Not all health departments have doctors that can see patients for sick visits, but many do, so it is worth a call to see if yours does. If you don't know where your local health department is, you can check in your phone book. The CDC also has a tool to help you locate your state's health department, and many state websites also have listings of all of their local health departments. ° °3) Find a Walk-in Clinic °If your illness is not likely to be very severe or complicated, you may want to try a walk in clinic. These are popping up all over the country in pharmacies, drugstores, and shopping centers. They're usually staffed by nurse practitioners or physician assistants that have been trained to treat common illnesses and complaints. They're usually fairly quick and inexpensive. However, if you have serious medical issues or chronic medical problems, these are probably not your best option. ° °No Primary Care Doctor: °- Call [facility] at  832-8000 - they can help you locate a primary care doctor that  accepts your insurance, provides certain services, etc. °- Physician Referral Service- 1-800-533-3463 ° °Chronic Pain Problems: °Organization         Address  Phone   Notes  °[facility]  (336) 297-2271 Patients need to be referred by their primary care doctor.  ° °Medication  Assistance: °Organization         Address  Phone   Notes  °[place] [facility] [address] °[place], [place] [address] (336) 641-8030 --Must be a resident of [place] °-- Must have NO insurance coverage whatsoever (no Medicaid/ Medicare, etc.) °-- The pt. MUST have a primary care doctor that directs their care regularly and follows them in the community °  °[facility]  (866) 331-1348   °[employer]  (888) 892-1162   ° °Agencies that provide inexpensive medical care: °Organization         Address  Phone   Notes  °[facility]  (336) 832-8035   °[facility]    (336) 832-7272   °[facility] [address] °[place], [place] [address] (336) 832-4777   °[facility] [address], °[place] (336) 271-4999   °[facility]    (336) 373-0678   °[facility]    (336) 272-1050   °[facility] ° [address], [place] Phone:  (336) 832-4444, Fax:  (336) 832-4440 Hours of Operation:  9 am - 6 pm, M-F.  Also accepts Medicaid/Medicare and self-pay.  °[facility] ° [address], [place] Phone: (336) 832-3150, Fax: (336) [phone]. Hours of Operation:  8:30 am - 5:30 pm, M-F.  Also accepts Medicaid and self-pay.  °[facility] [place] [address], [place] Phone: (336) 878-6027   °[facility] [address], [place], [place] (336)723-1848, Ext. 123 Mondays & Thursdays: 7-9 AM.  First 15 patients are seen on a first come, first serve basis. °  ° °Medicaid-accepting [place] Providers: ° °Organization         Address  Phone   Notes  °[facility] [address], [place] (336) 641-2100 Also accepts self-pay patients.  °[facility] [address], [place] ° (336) 856-9996   °[facility] [address], [place]  (336) 288-8857   °[facility] [address] [place] [address], [place] (336) 299-7000   °Veita Bland [address], [place]  ° (336) 373-1557 Only accepts [place] Access Medicaid patients after they have their name applied to their card.  ° °Self-Pay (no insurance) in [place]: ° °Organization         Address  Phone   Notes  °Sickle Cell Patients, [facility] [address], [place] (336) 832-1970   °[facility] [address], [place] (336) 832-4400   °[facility] ° [address], [place] (336) 992-4800   °[facility]/Dr. Osei-Bonsu ° [address] [place] [address], [place] or [address], [place] (336) 841-8500 Phone number for both [place] and [place] locations is the same.  °[facility] [address], [place] (336) 299-0000   °[facility] [address] [place] [address], [place] or [address] (336) 852-7530 °(336) 878-2260   °[facility] [address], [place] (336) 350-1642, phone; (336) 294-5005, fax Sees patients 1st and 3rd Saturday of every month.  Must not qualify for public or private insurance (i.e. Medicaid, Medicare, [place] Health Choice, Veterans' Benefits) • Household income should be no more than 200% of the poverty level •The clinic cannot treat you if you are pregnant or think you are pregnant • Sexually transmitted diseases are not treated at the clinic.  ° ° °Dental Care: °Organization         Address  Phone  Notes  °[place] [facility] [address], [place] (336) 641-6152 Accepts children up to age 21 who are enrolled in Medicaid or [place] Health Choice; pregnant women with a Medicaid card; and children who have applied for Medicaid or [place] Health Choice, but were declined, whose parents can pay a reduced fee at time of service.  °[place]  [facility] [place]  [address], [place] (336) 641-7733 Accepts children up to age 21 who are enrolled in Medicaid or [place] Health Choice; pregnant women with a Medicaid card; and children who have applied for Medicaid or [place] Health Choice, but were declined, whose parents can pay a reduced fee at time of service.  °[facility] ° [address], [place] (336) 641-4533 Patients are seen by appointment only. Walk-ins are not accepted. [facility] will see patients 18 years of age and older. °Monday - Tuesday (8am-5pm) °Most Wednesdays (8:30-5pm) °$30 per visit, cash only  °[facility] ° [address], [place] (336) 641-4533 Patients are seen by appointment only. Walk-ins are not accepted. [facility] will see patients 18 years of age and older. °One   Wednesday Evening (Monthly: Volunteer Based).  $30 per visit, cash only  °[facility]  (919) 537-3737 for adults; Children under age 4, call Graduate Pediatric Dentistry at ([phone]) [phone]. Children aged 4-14, please call (919) [phone] to request a pediatric application. ° Dental services are provided in all areas of dental care including fillings, crowns and bridges, complete and partial dentures, implants, gum treatment, root canals, and extractions. Preventive care is also provided. Treatment is provided to both adults and children. °Patients are selected via a lottery and there is often a waiting list. °  °[facility] [address], °[place] ° (336) 763-8833 [email or web address] °  °[facility] [address], [place], [place] (336)723-1848, Ext. 123 Second and Fourth Thursday of each month, opens at 6:30 AM; Clinic ends at 9 AM.  Patients are seen on a first-come first-served basis, and a limited number are seen during each clinic.  ° °[facility] ° [address], [place], [place] (336) 723-7904    Eligibility Requirements °You must have lived in [place], [place], or [place] counties for at least the last three months. °  You cannot be eligible for state or federal sponsored healthcare insurance, including Veterans Administration, Medicaid, or Medicare. °  You generally cannot be eligible for healthcare insurance through your employer.  °  How to apply: °Eligibility screenings are held every Tuesday and Wednesday afternoon from 1:00 pm until 4:00 pm. You do not need an appointment for the interview!  °[facility] [address], [place], [place] 336-631-2330   °[facility]  336-342-8273   °[facility]  336-703-3100   °[facility]  336-570-6415   ° °Behavioral Health Resources in the Community: °Intensive Outpatient Programs °Organization         Address  Phone  Notes  °[facility] [address], [place], [place] 336-878-6098   °[facility] [address], [place], [place] 336-832-9800   °[facility] [address], [place], [place] ° 336-882-2125   °[facility] [address],  °[place], [place] 1-800-853-5163 or 336-641-4981   °Substance Abuse Resources °Organization         Address  Phone  Notes  °[facility]  336-882-2125   °[facility]  336-784-9470   °[facility]  336-285-9073   °[facility]  336-845-3988   °Residential & Outpatient Substance Abuse Program  1-800-659-3381   °Psychological Services °Organization         Address  Phone  Notes  °[facility]  336- 832-9600   °[facility]  336- 378-7881   °[facility] [address], [place] 1-800-853-5163 or 336-641-4981   ° °Mobile Crisis Teams °Organization         Address  Phone  Notes  °[facility], Mobile Crisis Care Unit  1-877-626-1772   °Assertive °[facility] ° [address]  [place], [place] 336-834-9664   °Sharon DeEsch [address] °[place] 336-554-5454   ° °Self-Help/Support Groups °Organization         Address  Phone             Notes  °[facility]  - variety of support groups  336- 373-1402 Call for more information  °Narcotics Anonymous (NA), [facility] [address], °[place]  2 meetings at this location  ° °  Residential Treatment Programs Organization         Address  Phone  Notes  [facility] [address],    [place]  [phone]   [facility]  [address], [place] [phone], [place], [place] [phone](562) [phone]   [facility] [address], [place] [phone] Admissions: 8am-3pm M-F  [facility] [address]    [place], [place] [phone]   [facility] [address], [place], [place] [phone]   [facility] [address]  [place], [place] [phone]   [facility] Int[facility] Outpatient [address], [place], [place] [phone]   [facility] [address]  [place], [place] [phone] or [phone]   [facility] ([facility]) [address] [place], [place] [phone] Accepts Medicaid  [facility] [address]  [place] [phone] Substance Abuse/Addiction Treatment   [facility]         Address  Phone  Notes  [facility]  (289)544-7180([phone]) [phone]   Angie FavaJulie Brannon, PhD [address], [address] [place], [place]   [phone](336) ([phone] or [phone](336) [phone]   [facility]   [address] [place], [place] [phone](336) [phone]   [facility] [address], [place], [place] [phone](336) [phone] Insurance/Medicaid/sponsorship through [facility] [address]                                    [place], [place] [phone](336) [phone] Therapy/tele-psych/case    [facility] [address]   [place], [place] [phone](336) [phone]    Dr. Lolly MustacheArfeen  [phone](336) [phone]   [facility] [place] [facility]  [facility] 1) [address], [place] 2) [address], [place] 3)  [address], [place] [phone](336) [phone](336) [phone]  [phone](336) [phone]   [facility] 907-581-8185(336) 807-662-8724 or [phone](336) [phone] (After Hours)      Take the prescription as directed. Continue to take your usual prescriptions as previously directed. Try to keep yourself hydrated when you are outside in hot weather.  Apply moist heat or ice to the area(s) of discomfort, for 15 minutes at a time, several times per day for the next few days.  Do not fall asleep on a heating or ice pack.  Your CT scan showed an incidental finding of: "A nodule at the left thyroid lobe. Consider further evaluation with thyroid ultrasound."  Call your regular medical doctor on Monday to schedule a follow up appointment this week to further investigate this finding.  Return to the Emergency Department immediately sooner if worsening.

## 2014-11-04 NOTE — ED Notes (Signed)
Pt. Requesting to take her regular home pain medication. Pt. Able to do so per Dr. Clarene DukeMcManus.

## 2014-11-13 ENCOUNTER — Telehealth: Payer: Self-pay | Admitting: Orthopedic Surgery

## 2014-11-13 NOTE — Telephone Encounter (Signed)
Patient called to relay that she has started the 12-day Prednisone dose-pack as prescribed 11/02/14, and that both feet and ankles are very swollen.  She states she also has an appointment with her primary care, Dr Janna Archondiego, this week (11/16/14).  She is concerned, although "almost done with medication."  She states she also had a CT of her spine recently, per Emergency room physician, which she will be also addressing with primary care.  Please advise regarding medication.  Patient's ph# is [phone].

## 2014-11-13 NOTE — Telephone Encounter (Signed)
Routing to Dr Harrison 

## 2014-11-13 NOTE — Telephone Encounter (Signed)
Patient aware.

## 2014-11-13 NOTE — Telephone Encounter (Signed)
Try to finish the prednisone

## 2014-11-16 DIAGNOSIS — I119 Hypertensive heart disease without heart failure: Secondary | ICD-10-CM | POA: Diagnosis not present

## 2014-11-16 DIAGNOSIS — J309 Allergic rhinitis, unspecified: Secondary | ICD-10-CM | POA: Diagnosis not present

## 2014-11-16 DIAGNOSIS — M2550 Pain in unspecified joint: Secondary | ICD-10-CM | POA: Diagnosis not present

## 2014-11-16 DIAGNOSIS — M545 Low back pain: Secondary | ICD-10-CM | POA: Diagnosis not present

## 2014-11-17 DIAGNOSIS — R5383 Other fatigue: Secondary | ICD-10-CM | POA: Diagnosis not present

## 2014-11-27 ENCOUNTER — Telehealth: Payer: Self-pay | Admitting: Orthopedic Surgery

## 2014-11-27 ENCOUNTER — Encounter: Payer: Self-pay | Admitting: Orthopedic Surgery

## 2014-11-27 NOTE — Telephone Encounter (Signed)
Patient stopped into office this morning to request note to carry with her to airport for upcoming travel out of state, to indicate that she may need wheelchair assistance at the terminal, due to medical reasons.  States she already has handicapped sticker.  Note provided as per request.

## 2014-12-11 DIAGNOSIS — I10 Essential (primary) hypertension: Secondary | ICD-10-CM | POA: Diagnosis not present

## 2014-12-11 DIAGNOSIS — E041 Nontoxic single thyroid nodule: Secondary | ICD-10-CM | POA: Diagnosis not present

## 2014-12-11 DIAGNOSIS — R609 Edema, unspecified: Secondary | ICD-10-CM | POA: Diagnosis not present

## 2014-12-12 ENCOUNTER — Ambulatory Visit (INDEPENDENT_AMBULATORY_CARE_PROVIDER_SITE_OTHER): Payer: Medicare Other

## 2014-12-12 ENCOUNTER — Ambulatory Visit (INDEPENDENT_AMBULATORY_CARE_PROVIDER_SITE_OTHER): Payer: Medicare Other | Admitting: Orthopedic Surgery

## 2014-12-12 ENCOUNTER — Other Ambulatory Visit (HOSPITAL_COMMUNITY): Payer: Self-pay | Admitting: Internal Medicine

## 2014-12-12 ENCOUNTER — Encounter: Payer: Self-pay | Admitting: Orthopedic Surgery

## 2014-12-12 VITALS — BP 139/85 | Ht 64.0 in | Wt 284.0 lb

## 2014-12-12 DIAGNOSIS — M5410 Radiculopathy, site unspecified: Secondary | ICD-10-CM

## 2014-12-12 DIAGNOSIS — M170 Bilateral primary osteoarthritis of knee: Secondary | ICD-10-CM | POA: Diagnosis not present

## 2014-12-12 DIAGNOSIS — E041 Nontoxic single thyroid nodule: Secondary | ICD-10-CM

## 2014-12-12 NOTE — Progress Notes (Signed)
Patient ID: Abigail Montgomery, female   DOB: April 04, 1951, 64 y.o.   MRN: [id number]  Follow up visit  Chief Complaint  Patient presents with  . Follow-up    6 week follow up bilateral knee pain s/p medication    BP 139/85 mmHg  Ht  (1.626 m)  Wt 284 lb (128.822 kg)  BMI 48.72 kg/m2  Encounter Diagnoses  Name Primary?  . Radicular pain of left lower extremity Yes  . Primary osteoarthritis of both knees     The patient comes back after treatment of her left-sided radicular pain with gabapentin she is also on OxyContin IR and Robaxin. The gabapentin Robaxin and OxyIR did not help. She is in pool therapy. She also has osteoarthritis of both knees.  She continues with left-sided radicular pain in her leg radiating down into her foot.  At this point plain films and MRI are necessary to diagnose her condition and evaluate her lumbar spine for epidural injection  She can continue Robaxin. OxyIR is being prescribed elsewhere. I did advise her that this is an opiate which is habit forming.  I will call her with her results after I review her MRI and set up or epidurals after the mri.   Today I ordered a plain film of her lumbar spine. The patient's size obscure some the bony detail and makes the image last reliable however the following findings are noted: Joint space narrowing of the 45 disc possible anterolisthesis of L5 on S1 multiple levels of osteophytes in the lumbar levels consistent with spondylosis of the lumbar spine we also note loss of lordosis at the lumbar level

## 2014-12-12 NOTE — Patient Instructions (Signed)
We will schedule MRI for you and call you with appointment and results

## 2014-12-14 DIAGNOSIS — I10 Essential (primary) hypertension: Secondary | ICD-10-CM | POA: Diagnosis not present

## 2014-12-14 DIAGNOSIS — Z79899 Other long term (current) drug therapy: Secondary | ICD-10-CM | POA: Diagnosis not present

## 2014-12-14 DIAGNOSIS — J45909 Unspecified asthma, uncomplicated: Secondary | ICD-10-CM | POA: Diagnosis not present

## 2014-12-14 DIAGNOSIS — E559 Vitamin D deficiency, unspecified: Secondary | ICD-10-CM | POA: Diagnosis not present

## 2014-12-14 DIAGNOSIS — E785 Hyperlipidemia, unspecified: Secondary | ICD-10-CM | POA: Diagnosis not present

## 2014-12-14 DIAGNOSIS — Z23 Encounter for immunization: Secondary | ICD-10-CM | POA: Diagnosis not present

## 2014-12-15 ENCOUNTER — Ambulatory Visit (HOSPITAL_COMMUNITY)
Admission: RE | Admit: 2014-12-15 | Discharge: 2014-12-15 | Disposition: A | Discharge status: Home / Self Care | Payer: Medicare Other | Source: Ambulatory Visit | Attending: Internal Medicine | Admitting: Internal Medicine

## 2014-12-15 DIAGNOSIS — E042 Nontoxic multinodular goiter: Secondary | ICD-10-CM | POA: Diagnosis not present

## 2014-12-15 DIAGNOSIS — E041 Nontoxic single thyroid nodule: Secondary | ICD-10-CM

## 2014-12-19 DIAGNOSIS — L259 Unspecified contact dermatitis, unspecified cause: Secondary | ICD-10-CM | POA: Diagnosis not present

## 2014-12-28 ENCOUNTER — Ambulatory Visit
Admission: RE | Admit: 2014-12-28 | Discharge: 2014-12-28 | Disposition: A | Discharge status: Home / Self Care | Payer: Medicare Other | Source: Ambulatory Visit | Attending: Orthopedic Surgery | Admitting: Orthopedic Surgery

## 2014-12-28 DIAGNOSIS — M5410 Radiculopathy, site unspecified: Secondary | ICD-10-CM

## 2014-12-28 DIAGNOSIS — M545 Low back pain: Secondary | ICD-10-CM | POA: Diagnosis not present

## 2014-12-29 DIAGNOSIS — E041 Nontoxic single thyroid nodule: Secondary | ICD-10-CM | POA: Diagnosis not present

## 2014-12-29 DIAGNOSIS — I10 Essential (primary) hypertension: Secondary | ICD-10-CM | POA: Diagnosis not present

## 2014-12-29 DIAGNOSIS — R944 Abnormal results of kidney function studies: Secondary | ICD-10-CM | POA: Diagnosis not present

## 2015-01-02 ENCOUNTER — Other Ambulatory Visit (HOSPITAL_COMMUNITY): Payer: Self-pay | Admitting: Internal Medicine

## 2015-01-02 ENCOUNTER — Telehealth: Payer: Self-pay | Admitting: Orthopedic Surgery

## 2015-01-02 DIAGNOSIS — E041 Nontoxic single thyroid nodule: Secondary | ICD-10-CM

## 2015-01-02 NOTE — Telephone Encounter (Signed)
Dr. Romeo Apple Abigail Montgomery is returning your call regarding MRI, you can reach her today at [phone]

## 2015-01-02 NOTE — Telephone Encounter (Signed)
She is Abigail Montgomery witness  [facility]   referr her to neurosurgery there

## 2015-01-03 ENCOUNTER — Other Ambulatory Visit: Payer: Self-pay | Admitting: *Deleted

## 2015-01-03 DIAGNOSIS — M5126 Other intervertebral disc displacement, lumbar region: Secondary | ICD-10-CM

## 2015-01-03 DIAGNOSIS — M47816 Spondylosis without myelopathy or radiculopathy, lumbar region: Secondary | ICD-10-CM

## 2015-01-03 NOTE — Telephone Encounter (Signed)
REFERRED TO [facility]

## 2015-01-05 ENCOUNTER — Other Ambulatory Visit (HOSPITAL_COMMUNITY): Payer: Self-pay | Admitting: Internal Medicine

## 2015-01-05 ENCOUNTER — Ambulatory Visit (HOSPITAL_COMMUNITY)
Admission: RE | Admit: 2015-01-05 | Discharge: 2015-01-05 | Disposition: A | Discharge status: Home / Self Care | Payer: Medicare Other | Source: Ambulatory Visit | Attending: Internal Medicine | Admitting: Internal Medicine

## 2015-01-05 DIAGNOSIS — E041 Nontoxic single thyroid nodule: Secondary | ICD-10-CM

## 2015-01-05 DIAGNOSIS — E042 Nontoxic multinodular goiter: Secondary | ICD-10-CM | POA: Insufficient documentation

## 2015-01-05 MED ORDER — LIDOCAINE HCL (PF) 2 % IJ SOLN
INTRAMUSCULAR | Status: AC
  Administered 2015-01-05: 10 mL
  Filled 2015-01-05: qty 10

## 2015-01-05 NOTE — Discharge Instructions (Signed)
Thyroid Biopsy °The thyroid gland is a butterfly-shaped gland situated in the front of the neck. It produces hormones which affect metabolism, growth and development, and body temperature. A thyroid biopsy is a procedure in which small samples of tissue or fluid are removed from the thyroid gland or mass and examined under a microscope. This test is done to determine the cause of thyroid problems, such as infection, cancer, or other thyroid problems. °There are 2 ways to obtain samples: °1. Fine needle biopsy. Samples are removed using a thin needle inserted through the skin and into the thyroid gland or mass. °2. Open biopsy. Samples are removed after a cut (incision) is made through the skin. °LET YOUR CAREGIVER KNOW ABOUT:  °· Allergies. °· Medications taken including herbs, eye drops, over-the-counter medications, and creams. °· Use of steroids (by mouth or creams). °· Previous problems with anesthetics or numbing medicine. °· Possibility of pregnancy, if this applies. °· History of blood clots (thrombophlebitis). °· History of bleeding or blood problems. °· Previous surgery. °· Other health problems. °RISKS AND COMPLICATIONS °· Bleeding from the site. The risk of bleeding is higher if you have a bleeding disorder or are taking any blood thinning medications (anticoagulants). °· Infection. °· Injury to structures near the thyroid gland. °BEFORE THE PROCEDURE  °This is a procedure that can be done as an outpatient. Confirm the time that you need to arrive for your procedure. Confirm whether there is a need to fast or withhold any medications. A blood sample may be done to determine your blood clotting time. Medicine may be given to help you relax (sedative). °PROCEDURE °Fine needle biopsy. °You will be awake during the procedure. You may be asked to lie on your back with your head tipped backward to extend your neck. Let your caregiver know if you cannot tolerate the positioning. An area on your neck will be  cleansed. A needle is inserted through the skin of your neck. You may feel a mild discomfort during this procedure. You may be asked to avoid coughing, talking, swallowing, or making sounds during some portions of the procedure. The needle is withdrawn once tissue or fluid samples have been removed. Pressure may be applied to the neck to reduce swelling and ensure that bleeding has stopped. The samples will be sent for examination.  °Open biopsy. °You will be given general anesthesia. You will be asleep during the procedure. An incision is made in your neck. A sample of thyroid tissue or the mass is removed. The tissue sample or mass will be sent for examination. The sample or mass may be examined during the biopsy. If the sample or mass contains cancer cells, some or all of the thyroid gland may be removed. The incision is closed with stitches. °AFTER THE PROCEDURE  °Your recovery will be assessed and monitored. If there are no problems, as an outpatient, you should be able to go home shortly after the procedure. °If you had a fine needle biopsy: °· You may have soreness at the biopsy site for 1 to 2 days. °If you had an open biopsy:  °· You may have soreness at the biopsy site for 3 to 4 days. °· You may have a hoarse voice or sore throat for 1 to 2 days. °Obtaining the Test Results °It is your responsibility to obtain your test results. Do not assume everything is normal if you have not heard from your caregiver or the medical facility. It is important for you to follow up   on all of your test results. °HOME CARE INSTRUCTIONS  °· Keeping your head raised on a pillow when you are lying down may ease biopsy site discomfort. °· Supporting the back of your head and neck with both hands as you sit up from a lying position may ease biopsy site discomfort. °· Only take over-the-counter or prescription medicines for pain, discomfort, or fever as directed by your caregiver. °· Throat lozenges or gargling with warm salt  water may help to soothe a sore throat. °SEEK IMMEDIATE MEDICAL CARE IF:  °· You have severe bleeding from the biopsy site. °· You have difficulty swallowing. °· You have a fever. °· You have increased pain, swelling, redness, or warmth at the biopsy site. °· You notice pus coming from the biopsy site. °· You have swollen glands (lymph nodes) in your neck. °Document Released: 02/16/2007 Document Revised: 08/16/2012 Document Reviewed: 07/14/2013 °ExitCare® Patient Information ©2015 [employer]. This information is not intended to replace advice given to you by your health care provider. Make sure you discuss any questions you have with your health care provider. ° °

## 2015-01-05 NOTE — Procedures (Signed)
PreOperative Dx: BILATERAL thyroid nodules Postoperative Dx: BILATERAL thyroid nodules Procedure:   US guided FNA of BILATERAL thyroid nodules Radiologist:  Tyron Russell Anesthesia:  3 ml of 2% lidocaine Specimen:  FNA x 4 of RT nodule, FNA x 3 of LT nodule  EBL:   < 1 ml Complications: None

## 2015-01-12 NOTE — Telephone Encounter (Signed)
appt w/ DR Pennie Rushing HAYES 01/26/15 10AM

## 2015-01-26 DIAGNOSIS — E041 Nontoxic single thyroid nodule: Secondary | ICD-10-CM | POA: Diagnosis not present

## 2015-01-26 DIAGNOSIS — Z23 Encounter for immunization: Secondary | ICD-10-CM | POA: Diagnosis not present

## 2015-01-26 DIAGNOSIS — Z01419 Encounter for gynecological examination (general) (routine) without abnormal findings: Secondary | ICD-10-CM | POA: Diagnosis not present

## 2015-01-26 DIAGNOSIS — M4806 Spinal stenosis, lumbar region: Secondary | ICD-10-CM | POA: Diagnosis not present

## 2015-01-26 DIAGNOSIS — R944 Abnormal results of kidney function studies: Secondary | ICD-10-CM | POA: Diagnosis not present

## 2015-01-26 DIAGNOSIS — I10 Essential (primary) hypertension: Secondary | ICD-10-CM | POA: Diagnosis not present

## 2015-02-13 DIAGNOSIS — H52223 Regular astigmatism, bilateral: Secondary | ICD-10-CM | POA: Diagnosis not present

## 2015-02-13 DIAGNOSIS — H5203 Hypermetropia, bilateral: Secondary | ICD-10-CM | POA: Diagnosis not present

## 2015-02-13 DIAGNOSIS — H35363 Drusen (degenerative) of macula, bilateral: Secondary | ICD-10-CM | POA: Diagnosis not present

## 2015-02-13 DIAGNOSIS — H524 Presbyopia: Secondary | ICD-10-CM | POA: Diagnosis not present

## 2016-01-28 DIAGNOSIS — R7301 Impaired fasting glucose: Secondary | ICD-10-CM | POA: Diagnosis not present

## 2016-01-28 DIAGNOSIS — E041 Nontoxic single thyroid nodule: Secondary | ICD-10-CM | POA: Diagnosis not present

## 2016-01-28 DIAGNOSIS — E785 Hyperlipidemia, unspecified: Secondary | ICD-10-CM | POA: Diagnosis not present

## 2016-01-28 DIAGNOSIS — E119 Type 2 diabetes mellitus without complications: Secondary | ICD-10-CM | POA: Diagnosis not present

## 2016-01-28 DIAGNOSIS — E039 Hypothyroidism, unspecified: Secondary | ICD-10-CM | POA: Diagnosis not present

## 2016-01-28 DIAGNOSIS — I482 Chronic atrial fibrillation: Secondary | ICD-10-CM | POA: Diagnosis not present

## 2016-01-28 DIAGNOSIS — I10 Essential (primary) hypertension: Secondary | ICD-10-CM | POA: Diagnosis not present

## 2016-01-28 DIAGNOSIS — E782 Mixed hyperlipidemia: Secondary | ICD-10-CM | POA: Diagnosis not present

## 2016-01-30 DIAGNOSIS — G8929 Other chronic pain: Secondary | ICD-10-CM | POA: Diagnosis not present

## 2016-01-30 DIAGNOSIS — I10 Essential (primary) hypertension: Secondary | ICD-10-CM | POA: Diagnosis not present

## 2016-01-30 DIAGNOSIS — Z23 Encounter for immunization: Secondary | ICD-10-CM | POA: Diagnosis not present

## 2016-01-30 DIAGNOSIS — Z0000 Encounter for general adult medical examination without abnormal findings: Secondary | ICD-10-CM | POA: Diagnosis not present

## 2016-05-09 DIAGNOSIS — Z6841 Body Mass Index (BMI) 40.0 and over, adult: Secondary | ICD-10-CM | POA: Diagnosis not present

## 2016-05-09 DIAGNOSIS — M79673 Pain in unspecified foot: Secondary | ICD-10-CM | POA: Diagnosis not present

## 2016-05-26 ENCOUNTER — Other Ambulatory Visit (HOSPITAL_COMMUNITY): Payer: Self-pay | Admitting: Internal Medicine

## 2016-05-26 DIAGNOSIS — Z1231 Encounter for screening mammogram for malignant neoplasm of breast: Secondary | ICD-10-CM

## 2016-06-02 ENCOUNTER — Ambulatory Visit (HOSPITAL_COMMUNITY)
Admission: RE | Admit: 2016-06-02 | Discharge: 2016-06-02 | Disposition: A | Discharge status: Home / Self Care | Payer: Medicare Other | Source: Ambulatory Visit | Attending: Internal Medicine | Admitting: Internal Medicine

## 2016-06-02 DIAGNOSIS — Z1231 Encounter for screening mammogram for malignant neoplasm of breast: Secondary | ICD-10-CM

## 2016-12-08 DIAGNOSIS — I10 Essential (primary) hypertension: Secondary | ICD-10-CM | POA: Diagnosis not present

## 2016-12-08 DIAGNOSIS — M545 Low back pain: Secondary | ICD-10-CM | POA: Diagnosis not present

## 2016-12-22 DIAGNOSIS — M545 Low back pain: Secondary | ICD-10-CM | POA: Diagnosis not present

## 2016-12-22 DIAGNOSIS — I10 Essential (primary) hypertension: Secondary | ICD-10-CM | POA: Diagnosis not present

## 2016-12-22 DIAGNOSIS — Z6841 Body Mass Index (BMI) 40.0 and over, adult: Secondary | ICD-10-CM | POA: Diagnosis not present

## 2017-01-26 DIAGNOSIS — Z1159 Encounter for screening for other viral diseases: Secondary | ICD-10-CM | POA: Diagnosis not present

## 2017-01-26 DIAGNOSIS — I10 Essential (primary) hypertension: Secondary | ICD-10-CM | POA: Diagnosis not present

## 2017-01-28 DIAGNOSIS — I10 Essential (primary) hypertension: Secondary | ICD-10-CM | POA: Diagnosis not present

## 2017-01-28 DIAGNOSIS — G8929 Other chronic pain: Secondary | ICD-10-CM | POA: Diagnosis not present

## 2017-01-28 DIAGNOSIS — Z23 Encounter for immunization: Secondary | ICD-10-CM | POA: Diagnosis not present

## 2017-01-28 DIAGNOSIS — Z6841 Body Mass Index (BMI) 40.0 and over, adult: Secondary | ICD-10-CM | POA: Diagnosis not present

## 2017-01-28 DIAGNOSIS — Z0000 Encounter for general adult medical examination without abnormal findings: Secondary | ICD-10-CM | POA: Diagnosis not present

## 2017-02-03 ENCOUNTER — Other Ambulatory Visit (HOSPITAL_COMMUNITY): Payer: Self-pay | Admitting: Internal Medicine

## 2017-02-03 DIAGNOSIS — Z780 Asymptomatic menopausal state: Secondary | ICD-10-CM

## 2017-02-12 ENCOUNTER — Other Ambulatory Visit (HOSPITAL_COMMUNITY): Payer: Medicare Other

## 2017-02-18 ENCOUNTER — Other Ambulatory Visit (HOSPITAL_COMMUNITY): Payer: Medicare Other

## 2017-02-18 ENCOUNTER — Encounter (HOSPITAL_COMMUNITY): Payer: Self-pay

## 2017-03-30 DIAGNOSIS — S8012XA Contusion of left lower leg, initial encounter: Secondary | ICD-10-CM | POA: Diagnosis not present

## 2017-05-13 ENCOUNTER — Other Ambulatory Visit (HOSPITAL_COMMUNITY): Payer: Self-pay | Admitting: Internal Medicine

## 2017-05-13 ENCOUNTER — Ambulatory Visit (HOSPITAL_COMMUNITY)
Admission: RE | Admit: 2017-05-13 | Discharge: 2017-05-13 | Disposition: A | Discharge status: Home / Self Care | Payer: Medicare Other | Source: Ambulatory Visit | Attending: Internal Medicine | Admitting: Internal Medicine

## 2017-05-13 DIAGNOSIS — Z780 Asymptomatic menopausal state: Secondary | ICD-10-CM | POA: Diagnosis not present

## 2017-05-13 DIAGNOSIS — Z1231 Encounter for screening mammogram for malignant neoplasm of breast: Secondary | ICD-10-CM

## 2017-05-27 DIAGNOSIS — I10 Essential (primary) hypertension: Secondary | ICD-10-CM | POA: Diagnosis not present

## 2017-06-03 ENCOUNTER — Encounter (HOSPITAL_COMMUNITY): Payer: Self-pay

## 2017-06-03 ENCOUNTER — Ambulatory Visit (HOSPITAL_COMMUNITY)
Admission: RE | Admit: 2017-06-03 | Discharge: 2017-06-03 | Disposition: A | Discharge status: Home / Self Care | Payer: Medicare Other | Source: Ambulatory Visit | Attending: Internal Medicine | Admitting: Internal Medicine

## 2017-06-03 ENCOUNTER — Ambulatory Visit (HOSPITAL_COMMUNITY): Payer: Medicare Other

## 2017-06-03 DIAGNOSIS — Z1231 Encounter for screening mammogram for malignant neoplasm of breast: Secondary | ICD-10-CM | POA: Insufficient documentation

## 2017-06-10 DIAGNOSIS — I10 Essential (primary) hypertension: Secondary | ICD-10-CM | POA: Diagnosis not present

## 2017-06-17 ENCOUNTER — Encounter: Payer: Self-pay | Admitting: Gastroenterology

## 2017-07-08 ENCOUNTER — Ambulatory Visit (INDEPENDENT_AMBULATORY_CARE_PROVIDER_SITE_OTHER): Payer: Medicare Other | Admitting: Orthopaedic Surgery

## 2017-07-08 ENCOUNTER — Ambulatory Visit (INDEPENDENT_AMBULATORY_CARE_PROVIDER_SITE_OTHER): Payer: Medicare Other

## 2017-07-08 ENCOUNTER — Encounter (INDEPENDENT_AMBULATORY_CARE_PROVIDER_SITE_OTHER): Payer: Self-pay | Admitting: Orthopaedic Surgery

## 2017-07-08 VITALS — BP 157/76 | HR 82 | Ht 64.0 in | Wt 303.0 lb

## 2017-07-08 DIAGNOSIS — M545 Low back pain: Secondary | ICD-10-CM

## 2017-07-08 DIAGNOSIS — G8929 Other chronic pain: Secondary | ICD-10-CM | POA: Diagnosis not present

## 2017-07-08 NOTE — Progress Notes (Addendum)
Office Visit Note   Patient: Abigail Montgomery           Date of Birth: 10/08/1950           MRN: [id number] Visit Date: 07/08/2017              Requested by: Benita Stabile, MD [address] [place], [place] [address] PCP: Benita Stabile, MD   Assessment & Plan: Visit Diagnoses:  1. Chronic bilateral low back pain, with sciatica presence unspecified     Plan: Patient ambulates she has a cane.  She asked about an epidural steroid injection we discussed that might give her some temporary improvement.  She had far left lateral disc protrusion consistent with the pain that she has in the left anterior thigh on occasion.  L4-5 disc space was narrow with disc protrusion without significant stenosis.  She does have facet arthropathy noted on plain radiographs as well as previous MRI scan.  If she develops progressive radicular symptoms or neurogenic claudication symptoms then she could be reevaluated with a new MRI scan and compared to August 2016 MRI imaging.  Otherwise she can proceed with her plans for water aerobics, dieting, weight loss and core strengthening, attend the bariatric class for additional information to see if this is something she is interested in and can return here on a as needed basis.  Follow-Up Instructions: No Follow-up on file.   Orders:  Orders Placed This Encounter  Procedures  . XR Lumbar Spine 2-3 Views   No orders of the defined types were placed in this encounter.     Procedures: No procedures performed   Clinical Data: No additional findings.   Subjective: Chief Complaint  Patient presents with  . Lower Back - Pain    HPI new patient visit 67 year old female with chronic back pain.  She was referred by Dr. Romeo Apple to [facility] neurosurgery in 2006 and was told surgery was not indicated.  She has pain in both legs more on the left than right had an MRI in 2016 which is available for review.  She uses oxycodone 5 mg sparingly.  She has asthma some  swelling in her feet and ankles takes Lasix lisinopril and HCTZ for hypertension.  Denies fever chills or fever no associated bowel or bladder symptoms she does have morbid obesity with weight 303 pounds and BMI 52.  Review of Systems positive for lumbar disc protrusion, asthma, abdominal incisional hernia.  Knee arthritis.  Morbid obesity, previous tonsillectomy 1956, hysterectomy 1991, fibroid removal 1998.  Distant history of pneumonia, depression anxiety asthma bladder problems bronchitis, depression and hypertension.  Otherwise rest review of systems 14 point is negative as it pertains HPI.   Objective: Vital Signs: BP (!) 157/76   Pulse 82   Ht 5\' 4"  (1.626 m)   Wt (!) 303 lb (137.4 kg)   BMI 52.01 kg/m   Physical Exam  Constitutional: She is oriented to person, place, and time. She appears well-developed.  HENT:  Head: Normocephalic.  Right Ear: External ear normal.  Left Ear: External ear normal.  Eyes: Pupils are equal, round, and reactive to light.  Neck: No tracheal deviation present. No thyromegaly present.  Cardiovascular: Normal rate.  Pulmonary/Chest: Effort normal.  Abdominal: Soft.  Morbid obesity with midline abdominal hernia.  Neurological: She is alert and oriented to person, place, and time.  Skin: Skin is warm and dry.  Psychiatric: She has a normal mood and affect. Her behavior is normal.  Ortho Exam patient has mild trochanteric tenderness.  Negative straight leg raising 90 degrees.  Anterior tib EHL is intact.  Knee and ankle jerk is 1+ and symmetrical.  Specialty Comments:  No specialty comments available.  Imaging: CLINICAL DATA:  Increasing low back pain and left leg pain for 6 months.  EXAM: MRI LUMBAR SPINE WITHOUT CONTRAST  TECHNIQUE: Multiplanar, multisequence MR imaging of the lumbar spine was performed. No intravenous contrast was administered.  COMPARISON:  Lumbar radiographs dated 12/12/2014  FINDINGS: Tip of the conus is at  L1-2 and appears normal. Normal paraspinal soft tissues.  T11-12 through L2-3: The discs are normal. Minimal degenerative changes of the facet joints at L1-2 and L2-3.  L3-4: Asymmetric disc bulge with a small protrusion to the left extending into the left neural foramen slightly posteriorly deviating the left L3 nerve lateral to the neural foramen.  L4-5: Marked disc space narrowing. Small broad-based disc protrusion with accompanying osteophytes extending foraminal without focal neural impingement. Slight hypertrophy of the left facet joint and ligamentum flavum without neural impingement.  L5-S1:  Normal disc.  Slight bilateral facet arthritis.  IMPRESSION: 1. Small left far lateral disc protrusion at L3-4 with a slight mass effect upon the left L3 nerve lateral to the neural foramen. 2. Small broad-based disc protrusion at L4-5 without focal neural impingement. 3. Slight facet arthritis in the lower lumbar spine, most prominent at L4-5 to the left.   Electronically Signed   By: Francene BoyersJames  Maxwell M.D.   On: 12/28/2014 16:53    PMFS History: Patient Active Problem List   Diagnosis Date Noted  . Incisional hernia 08/18/2014  . Arthritis of knee, degenerative 10/09/2010   Past Medical History:  Diagnosis Date  . Abdominal pain   . Arthritis   . Back pain   . Chronic knee pain   . Complication of anesthesia   . DDD (degenerative disc disease), cervical   . Hypertension   . Knee pain   . Left ankle pain   . Left arm pain   . Low back pain   . Peripheral neuropathy   . PONV (postoperative nausea and vomiting)   . Radiculopathy     Family History  Problem Relation Age of Onset  . Heart disease Mother   . Parkinson's disease Father   . Heart disease Unknown   . Arthritis Unknown   . Diabetes Unknown   . Kidney disease Unknown     Past Surgical History:  Procedure Laterality Date  . ABDOMINAL HYSTERECTOMY    . cyst removed from lower abdomen    .  INCISIONAL HERNIA REPAIR N/A 08/18/2014   Procedure: Sherald HessINCISIONAL HERNIORRHAPHY WITH MESH;  Surgeon: Franky MachoMark Jenkins Md, MD;  Location: [facility];  Service: General;  Laterality: N/A;  . INSERTION OF MESH N/A 08/18/2014   Procedure: INSERTION OF MESH;  Surgeon: Franky MachoMark Jenkins Md, MD;  Location: [facility];  Service: General;  Laterality: N/A;  . TONSILECTOMY, ADENOIDECTOMY, BILATERAL MYRINGOTOMY AND TUBES    . UTERINE FIBROID SURGERY     Social History   Occupational History  . Occupation: homemaker    Employer: unemployed  Tobacco Use  . Smoking status: Never Smoker  . Smokeless tobacco: Never Used  Substance and Sexual Activity  . Alcohol use: Yes    Comment: occ  . Drug use: No  . Sexual activity: Yes    Birth control/protection: Surgical

## 2017-07-23 DIAGNOSIS — H52223 Regular astigmatism, bilateral: Secondary | ICD-10-CM | POA: Diagnosis not present

## 2017-07-23 DIAGNOSIS — H524 Presbyopia: Secondary | ICD-10-CM | POA: Diagnosis not present

## 2017-07-23 DIAGNOSIS — H25811 Combined forms of age-related cataract, right eye: Secondary | ICD-10-CM | POA: Diagnosis not present

## 2017-07-23 DIAGNOSIS — H5203 Hypermetropia, bilateral: Secondary | ICD-10-CM | POA: Diagnosis not present

## 2017-08-03 DIAGNOSIS — R1030 Lower abdominal pain, unspecified: Secondary | ICD-10-CM | POA: Diagnosis not present

## 2017-08-03 DIAGNOSIS — M545 Low back pain: Secondary | ICD-10-CM | POA: Diagnosis not present

## 2017-08-04 ENCOUNTER — Other Ambulatory Visit: Payer: Self-pay

## 2017-08-04 ENCOUNTER — Ambulatory Visit: Payer: Medicare Other | Admitting: Gastroenterology

## 2017-08-04 ENCOUNTER — Encounter: Payer: Self-pay | Admitting: Gastroenterology

## 2017-08-04 ENCOUNTER — Telehealth: Payer: Self-pay

## 2017-08-04 DIAGNOSIS — G8929 Other chronic pain: Secondary | ICD-10-CM | POA: Diagnosis not present

## 2017-08-04 DIAGNOSIS — R1031 Right lower quadrant pain: Secondary | ICD-10-CM

## 2017-08-04 DIAGNOSIS — Z1211 Encounter for screening for malignant neoplasm of colon: Secondary | ICD-10-CM | POA: Diagnosis not present

## 2017-08-04 MED ORDER — CLENPIQ 10-3.5-12 MG-GM -GM/160ML PO SOLN: 1.0000 | Freq: Once | ORAL | 0 refills | Status: AC

## 2017-08-04 NOTE — Patient Instructions (Signed)
1. Colonoscopy as scheduled. See separate instructions.  

## 2017-08-04 NOTE — Telephone Encounter (Signed)
Called and informed pt of pre-op appt 08/17/17 at 1:45pm. Letter mailed.

## 2017-08-04 NOTE — Progress Notes (Signed)
CC'D TO PCP °

## 2017-08-04 NOTE — Assessment & Plan Note (Signed)
By description sounds more musculoskeletal in nature although adhesive disease is a possibility.  Given chronicity and description, unlikely related to her appendix.  Abdominal exam completely benign today.  Proceed with colonoscopy as planned.  Could consider further evaluation after colonoscopy if needed.

## 2017-08-04 NOTE — Assessment & Plan Note (Signed)
Screening colonoscopy in the near future.  Plan for deep sedation with propofol given polypharmacy, chronic narcotics.  Patient complains of post anesthesia nausea and vomiting with prior surgeries.  However did well after her hernia repair in 2016.   I have discussed the risks, alternatives, benefits with regards to but not limited to the risk of reaction to medication, bleeding, infection, perforation and the patient is agreeable to proceed. Written consent to be obtained.

## 2017-08-04 NOTE — H&P (View-Only) (Signed)
Primary Care Physician:  Hall, John Z, MD  Primary Gastroenterologist:  Michael Rourk, MD   Chief Complaint  Patient presents with  . Colonoscopy    consult  . Abdominal Pain    right side x few months, happens off/on    HPI:  Abigail Montgomery is a 66 y.o. female here at the request of Dr. Hall for consideration of colonoscopy.  Patient has never had a complete colonoscopy.  She describes having sigmoidoscopies in the remote past by Dr. Crosby.  Father had colon polyps at an advanced age.  Patient's bowel function is regular.  No blood in the stool or melena.  No upper GI symptoms.  She complains of pain in the abdomen, comes from right flank area and goes down towards the right lower quadrant occurring for several months, intermittent in nature.  Symptoms not related to meals or bowel function.  Worse with heavy lifting and straining.  She has a history of bulging disc in her lower back with predominant left-sided radiculopathy.    Current Outpatient Medications  Medication Sig Dispense Refill  . albuterol (PROVENTIL HFA;VENTOLIN HFA) 108 (90 BASE) MCG/ACT inhaler Inhale 2 puffs into the lungs every 6 (six) hours as needed for wheezing or shortness of breath.    . Ascorbic Acid (VITAMIN C) 1000 MG tablet Take 1,000 mg by mouth daily as needed (patients takes if she feels like she is coming down with a cold.).    . cetirizine (ZYRTEC) 10 MG tablet Take 10 mg by mouth as needed.     . cholecalciferol (VITAMIN D) 1000 UNITS tablet Take 4,000 Units by mouth daily.    . citalopram (CELEXA) 20 MG tablet Take 20 mg by mouth daily.    . furosemide (LASIX) 20 MG tablet Take 20 mg by mouth as needed.    . lisinopril-hydrochlorothiazide (PRINZIDE,ZESTORETIC) 20-12.5 MG per tablet Take 1 tablet by mouth daily.    . Magnesium 250 MG TABS Take 250 mg by mouth as needed.     . meloxicam (MOBIC) 15 MG tablet Take 15 mg by mouth daily.    . oxycodone (OXY-IR) 5 MG capsule Take 5 mg by mouth every 4  (four) hours as needed.    . polyvinyl alcohol-povidone (HYPOTEARS) 1.4-0.6 % ophthalmic solution 1-2 drops as needed.    . Probiotic Product ([employer] ADV PROBIOTIC GUMMIES PO) Take 3 tablets by mouth daily.     Current Facility-Administered Medications  Medication Dose Route Frequency Provider Last Rate Last Dose  . methylPREDNISolone acetate (DEPO-MEDROL) injection 40 mg  40 mg Intra-articular Once Harrison, Stanley E, MD        Allergies as of 08/04/2017 - Review Complete 08/04/2017  Allergen Reaction Noted  . Latex Rash 02/27/2013    Past Medical History:  Diagnosis Date  . Abdominal pain   . Arthritis   . Back pain   . Chronic knee pain   . Complication of anesthesia   . DDD (degenerative disc disease), cervical   . Hypertension   . Knee pain   . Left ankle pain   . Left arm pain   . Low back pain   . Peripheral neuropathy   . PONV (postoperative nausea and vomiting)   . Radiculopathy     Past Surgical History:  Procedure Laterality Date  . ABDOMINAL HYSTERECTOMY    . cyst removed from lower abdomen    . INCISIONAL HERNIA REPAIR N/A 08/18/2014   Procedure: INCISIONAL HERNIORRHAPHY WITH MESH;  Surgeon: Mark Jenkins   Md, MD;  Location: [facility];  Service: General;  Laterality: N/A;  . INSERTION OF MESH N/A 08/18/2014   Procedure: INSERTION OF MESH;  Surgeon: Mark Jenkins Md, MD;  Location: [facility];  Service: General;  Laterality: N/A;  . TONSILECTOMY, ADENOIDECTOMY, BILATERAL MYRINGOTOMY AND TUBES    . UTERINE FIBROID SURGERY      Family History  Problem Relation Age of Onset  . Heart disease Mother   . Parkinson's disease Father   . Colon polyps Father        >60  . Heart disease Unknown   . Arthritis Unknown   . Diabetes Unknown   . Kidney disease Unknown   . Colon cancer Neg Hx     Social History   Socioeconomic History  . Marital status: Married    Spouse name: Not on file  . Number of children: Not on file  . Years of education: Not on file  . Highest  education level: Not on file  Occupational History  . Occupation: homemaker    Employer: unemployed  Social Needs  . Financial resource strain: Not on file  . Food insecurity:    Worry: Not on file    Inability: Not on file  . Transportation needs:    Medical: Not on file    Non-medical: Not on file  Tobacco Use  . Smoking status: Never Smoker  . Smokeless tobacco: Never Used  Substance and Sexual Activity  . Alcohol use: Yes    Comment: occ  . Drug use: No  . Sexual activity: Yes    Birth control/protection: Surgical  Lifestyle  . Physical activity:    Days per week: Not on file    Minutes per session: Not on file  . Stress: Not on file  Relationships  . Social connections:    Talks on phone: Not on file    Gets together: Not on file    Attends religious service: Not on file    Active member of club or organization: Not on file    Attends meetings of clubs or organizations: Not on file    Relationship status: Not on file  . Intimate partner violence:    Fear of current or ex partner: Not on file    Emotionally abused: Not on file    Physically abused: Not on file    Forced sexual activity: Not on file  Other Topics Concern  . Not on file  Social History Narrative  . Not on file      ROS:  General: Negative for anorexia, weight loss, fever, chills, fatigue, weakness. Eyes: Negative for vision changes.  ENT: Negative for hoarseness, difficulty swallowing , nasal congestion. CV: Negative for chest pain, angina, palpitations, dyspnea on exertion, peripheral edema.  Respiratory: Negative for dyspnea at rest, dyspnea on exertion, cough, sputum, wheezing.  GI: See history of present illness. GU:  Negative for dysuria, hematuria, urinary incontinence, urinary frequency, nocturnal urination.  MS: Chronic joint and back pain Derm: Negative for rash or itching.  Neuro: Negative for weakness, abnormal sensation, seizure, frequent headaches, memory loss, confusion.   Psych: Negative for anxiety, depression, suicidal ideation, hallucinations.  Endo: Negative for unusual weight change.  Heme: Negative for bruising or bleeding. Allergy: Negative for rash or hives.    Physical Examination:  BP (!) 156/79   Pulse 93   Temp 98.3 F (36.8 C) (Oral)   Ht 5' 4" (1.626 m)   Wt 294 lb 6.4 oz (133.5 kg)     BMI 50.53 kg/m    General: Well-nourished, well-developed in no acute distress.  Head: Normocephalic, atraumatic.   Eyes: Conjunctiva pink, no icterus. Mouth: Oropharyngeal mucosa moist and pink , no lesions erythema or exudate. Neck: Supple without thyromegaly, masses, or lymphadenopathy.  Lungs: Clear to auscultation bilaterally.  Heart: Regular rate and rhythm, no murmurs rubs or gallops.  Abdomen: Bowel sounds are normal, nondistended, no hepatosplenomegaly or masses, no abdominal bruits or    hernia , no rebound or guarding.  Abdomen obese.  Abdomen nontender with palpation, cannot reproduce symptoms.  No CVA tenderness  rectal: Not performed Extremities: No lower extremity edema. No clubbing or deformities.  Neuro: Alert and oriented x 4 , grossly normal neurologically.  Skin: Warm and dry, no rash or jaundice.   Psych: Alert and cooperative, normal mood and affect.   Imaging Studies: No results found.   

## 2017-08-04 NOTE — Progress Notes (Signed)
Primary Care Physician:  Benita Stabile, MD  Primary Gastroenterologist:  Roetta Sessions, MD   Chief Complaint  Patient presents with  . Colonoscopy    consult  . Abdominal Pain    right side x few months, happens off/on    HPI:  Abigail Montgomery is a 67 y.o. female here at the request of Dr. Margo Aye for consideration of colonoscopy.  Patient has never had a complete colonoscopy.  She describes having sigmoidoscopies in the remote past by Dr. Esmeralda Arthur.  Father had colon polyps at an advanced age.  Patient's bowel function is regular.  No blood in the stool or melena.  No upper GI symptoms.  She complains of pain in the abdomen, comes from right flank area and goes down towards the right lower quadrant occurring for several months, intermittent in nature.  Symptoms not related to meals or bowel function.  Worse with heavy lifting and straining.  She has a history of bulging disc in her lower back with predominant left-sided radiculopathy.    Current Outpatient Medications  Medication Sig Dispense Refill  . albuterol (PROVENTIL HFA;VENTOLIN HFA) 108 (90 BASE) MCG/ACT inhaler Inhale 2 puffs into the lungs every 6 (six) hours as needed for wheezing or shortness of breath.    . Ascorbic Acid (VITAMIN C) 1000 MG tablet Take 1,000 mg by mouth daily as needed (patients takes if she feels like she is coming down with a cold.).    Marland Kitchen cetirizine (ZYRTEC) 10 MG tablet Take 10 mg by mouth as needed.     . cholecalciferol (VITAMIN D) 1000 UNITS tablet Take 4,000 Units by mouth daily.    . citalopram (CELEXA) 20 MG tablet Take 20 mg by mouth daily.    . furosemide (LASIX) 20 MG tablet Take 20 mg by mouth as needed.    Marland Kitchen lisinopril-hydrochlorothiazide (PRINZIDE,ZESTORETIC) 20-12.5 MG per tablet Take 1 tablet by mouth daily.    . Magnesium 250 MG TABS Take 250 mg by mouth as needed.     . meloxicam (MOBIC) 15 MG tablet Take 15 mg by mouth daily.    Marland Kitchen oxycodone (OXY-IR) 5 MG capsule Take 5 mg by mouth every 4  (four) hours as needed.    . polyvinyl alcohol-povidone (HYPOTEARS) 1.4-0.6 % ophthalmic solution 1-2 drops as needed.    . Probiotic Product ([employer]S ADV PROBIOTIC GUMMIES PO) Take 3 tablets by mouth daily.     Current Facility-Administered Medications  Medication Dose Route Frequency Provider Last Rate Last Dose  . methylPREDNISolone acetate (DEPO-MEDROL) injection 40 mg  40 mg Intra-articular Once Vickki Hearing, MD        Allergies as of 08/04/2017 - Review Complete 08/04/2017  Allergen Reaction Noted  . Latex Rash 02/27/2013    Past Medical History:  Diagnosis Date  . Abdominal pain   . Arthritis   . Back pain   . Chronic knee pain   . Complication of anesthesia   . DDD (degenerative disc disease), cervical   . Hypertension   . Knee pain   . Left ankle pain   . Left arm pain   . Low back pain   . Peripheral neuropathy   . PONV (postoperative nausea and vomiting)   . Radiculopathy     Past Surgical History:  Procedure Laterality Date  . ABDOMINAL HYSTERECTOMY    . cyst removed from lower abdomen    . INCISIONAL HERNIA REPAIR N/A 08/18/2014   Procedure: INCISIONAL HERNIORRHAPHY WITH MESH;  Surgeon: Franky Macho  Md, MD;  Location: [facility];  Service: General;  Laterality: N/A;  . INSERTION OF MESH N/A 08/18/2014   Procedure: INSERTION OF MESH;  Surgeon: Franky Macho Md, MD;  Location: [facility];  Service: General;  Laterality: N/A;  . TONSILECTOMY, ADENOIDECTOMY, BILATERAL MYRINGOTOMY AND TUBES    . UTERINE FIBROID SURGERY      Family History  Problem Relation Age of Onset  . Heart disease Mother   . Parkinson's disease Father   . Colon polyps Father        >60  . Heart disease Unknown   . Arthritis Unknown   . Diabetes Unknown   . Kidney disease Unknown   . Colon cancer Neg Hx     Social History   Socioeconomic History  . Marital status: Married    Spouse name: Not on file  . Number of children: Not on file  . Years of education: Not on file  . Highest  education level: Not on file  Occupational History  . Occupation: Dentist: unemployed  Social Needs  . Financial resource strain: Not on file  . Food insecurity:    Worry: Not on file    Inability: Not on file  . Transportation needs:    Medical: Not on file    Non-medical: Not on file  Tobacco Use  . Smoking status: Never Smoker  . Smokeless tobacco: Never Used  Substance and Sexual Activity  . Alcohol use: Yes    Comment: occ  . Drug use: No  . Sexual activity: Yes    Birth control/protection: Surgical  Lifestyle  . Physical activity:    Days per week: Not on file    Minutes per session: Not on file  . Stress: Not on file  Relationships  . Social connections:    Talks on phone: Not on file    Gets together: Not on file    Attends religious service: Not on file    Active member of club or organization: Not on file    Attends meetings of clubs or organizations: Not on file    Relationship status: Not on file  . Intimate partner violence:    Fear of current or ex partner: Not on file    Emotionally abused: Not on file    Physically abused: Not on file    Forced sexual activity: Not on file  Other Topics Concern  . Not on file  Social History Narrative  . Not on file      ROS:  General: Negative for anorexia, weight loss, fever, chills, fatigue, weakness. Eyes: Negative for vision changes.  ENT: Negative for hoarseness, difficulty swallowing , nasal congestion. CV: Negative for chest pain, angina, palpitations, dyspnea on exertion, peripheral edema.  Respiratory: Negative for dyspnea at rest, dyspnea on exertion, cough, sputum, wheezing.  GI: See history of present illness. GU:  Negative for dysuria, hematuria, urinary incontinence, urinary frequency, nocturnal urination.  MS: Chronic joint and back pain Derm: Negative for rash or itching.  Neuro: Negative for weakness, abnormal sensation, seizure, frequent headaches, memory loss, confusion.   Psych: Negative for anxiety, depression, suicidal ideation, hallucinations.  Endo: Negative for unusual weight change.  Heme: Negative for bruising or bleeding. Allergy: Negative for rash or hives.    Physical Examination:  BP (!) 156/79   Pulse 93   Temp 98.3 F (36.8 C) (Oral)   Ht 5\' 4"  (1.626 m)   Wt 294 lb 6.4 oz (133.5 kg)  BMI 50.53 kg/m    General: Well-nourished, well-developed in no acute distress.  Head: Normocephalic, atraumatic.   Eyes: Conjunctiva pink, no icterus. Mouth: Oropharyngeal mucosa moist and pink , no lesions erythema or exudate. Neck: Supple without thyromegaly, masses, or lymphadenopathy.  Lungs: Clear to auscultation bilaterally.  Heart: Regular rate and rhythm, no murmurs rubs or gallops.  Abdomen: Bowel sounds are normal, nondistended, no hepatosplenomegaly or masses, no abdominal bruits or    hernia , no rebound or guarding.  Abdomen obese.  Abdomen nontender with palpation, cannot reproduce symptoms.  No CVA tenderness  rectal: Not performed Extremities: No lower extremity edema. No clubbing or deformities.  Neuro: Alert and oriented x 4 , grossly normal neurologically.  Skin: Warm and dry, no rash or jaundice.   Psych: Alert and cooperative, normal mood and affect.   Imaging Studies: No results found.

## 2017-08-13 NOTE — Patient Instructions (Signed)
ZOELLE MARKUS  08/13/2017     @PREFPERIOPPHARMACY @   Your procedure is scheduled on  08/24/2017   Report to [facility] at  645   A.M.  Call this number if you have problems the morning of surgery:  [phone]   Remember:  Do not eat food or drink liquids after midnight.  Take these medicines the morning of surgery with A SIP OF WATER  Celexa, lisinopril, oxy IR.   Do not wear jewelry, make-up or nail polish.  Do not wear lotions, powders, or perfumes, or deodorant.  Do not shave 48 hours prior to surgery.  Men may shave face and neck.  Do not bring valuables to the hospital.  [facility] is not responsible for any belongings or valuables.  Contacts, dentures or bridgework may not be worn into surgery.  Leave your suitcase in the car.  After surgery it may be brought to your room.  For patients admitted to the hospital, discharge time will be determined by your treatment team.  Patients discharged the day of surgery will not be allowed to drive home.   Name and phone number of your driver:   family Special instructions:  Follow he diet and prep instructions given to you by Dr Luvenia Starch office.  Please read over the following fact sheets that you were given. Anesthesia Post-op Instructions and Care and Recovery After Surgery       Colonoscopy, Adult A colonoscopy is an exam to look at the large intestine. It is done to check for problems, such as:  Lumps (tumors).  Growths (polyps).  Swelling (inflammation).  Bleeding.  What happens before the procedure? Eating and drinking Follow instructions from your doctor about eating and drinking. These instructions may include:  A few days before the procedure - follow a low-fiber diet. ? Avoid nuts. ? Avoid seeds. ? Avoid dried fruit. ? Avoid raw fruits. ? Avoid vegetables.  1-3 days before the procedure - follow a clear liquid diet. Avoid liquids that have red or purple dye. Drink only clear  liquids, such as: ? Clear broth or bouillon. ? Black coffee or tea. ? Clear juice. ? Clear soft drinks or sports drinks. ? Gelatin dessert. ? Popsicles.  On the day of the procedure - do not eat or drink anything during the 2 hours before the procedure.  Bowel prep If you were prescribed an oral bowel prep:  Take it as told by your doctor. Starting the day before your procedure, you will need to drink a lot of liquid. The liquid will cause you to poop (have bowel movements) until your poop is almost clear or light green.  If your skin or butt gets irritated from diarrhea, you may: ? Wipe the area with wipes that have medicine in them, such as adult wet wipes with aloe and vitamin E. ? Put something on your skin that soothes the area, such as petroleum jelly.  If you throw up (vomit) while drinking the bowel prep, take a break for up to 60 minutes. Then begin the bowel prep again. If you keep throwing up and you cannot take the bowel prep without throwing up, call your doctor.  General instructions  Ask your doctor about changing or stopping your normal medicines. This is important if you take diabetes medicines or blood thinners.  Plan to have someone take you home from the hospital or clinic. What happens during the procedure?  An IV tube may be put into one of your veins.  You will be given medicine to help you relax (sedative).  To reduce your risk of infection: ? Your doctors will wash their hands. ? Your anal area will be washed with soap.  You will be asked to lie on your side with your knees bent.  Your doctor will get a long, thin, flexible tube ready. The tube will have a camera and a light on the end.  The tube will be put into your anus.  The tube will be gently put into your large intestine.  Air will be delivered into your large intestine to keep it open. You may feel some pressure or cramping.  The camera will be used to take photos.  A small tissue  sample may be removed from your body to be looked at under a microscope (biopsy). If any possible problems are found, the tissue will be sent to a lab for testing.  If small growths are found, your doctor may remove them and have them checked for cancer.  The tube that was put into your anus will be slowly removed. The procedure may vary among doctors and hospitals. What happens after the procedure?  Your doctor will check on you often until the medicines you were given have worn off.  Do not drive for 24 hours after the procedure.  You may have a small amount of blood in your poop.  You may Sachdeva gas.  You may have mild cramps or bloating in your belly (abdomen).  It is up to you to get the results of your procedure. Ask your doctor, or the department performing the procedure, when your results will be ready. This information is not intended to replace advice given to you by your health care provider. Make sure you discuss any questions you have with your health care provider. Document Released: 05/24/2010 Document Revised: 02/20/2016 Document Reviewed: 07/03/2015 Elsevier Interactive Patient Education  2017 [employer]  Colonoscopy, Adult, Care After This sheet gives you information about how to care for yourself after your procedure. Your health care provider may also give you more specific instructions. If you have problems or questions, contact your health care provider. What can I expect after the procedure? After the procedure, it is common to have:  A small amount of blood in your stool for 24 hours after the procedure.  Some gas.  Mild abdominal cramping or bloating.  Follow these instructions at home: General instructions   For the first 24 hours after the procedure: ? Do not drive or use machinery. ? Do not sign important documents. ? Do not drink alcohol. ? Do your regular daily activities at a slower pace than normal. ? Eat soft, easy-to-digest foods. ? Rest  often.  Take over-the-counter or prescription medicines only as told by your health care provider.  It is up to you to get the results of your procedure. Ask your health care provider, or the department performing the procedure, when your results will be ready. Relieving cramping and bloating  Try walking around when you have cramps or feel bloated.  Apply heat to your abdomen as told by your health care provider. Use a heat source that your health care provider recommends, such as a moist heat pack or a heating pad. ? Place a towel between your skin and the heat source. ? Leave the heat on for 20-30 minutes. ? Remove the heat if your skin turns bright red. This is  especially important if you are unable to feel pain, heat, or cold. You may have a greater risk of getting burned. Eating and drinking  Drink enough fluid to keep your urine clear or pale yellow.  Resume your normal diet as instructed by your health care provider. Avoid heavy or fried foods that are hard to digest.  Avoid drinking alcohol for as long as instructed by your health care provider. Contact a health care provider if:  You have blood in your stool 2-3 days after the procedure. Get help right away if:  You have more than a small spotting of blood in your stool.  You [place] large blood clots in your stool.  Your abdomen is swollen.  You have nausea or vomiting.  You have a fever.  You have increasing abdominal pain that is not relieved with medicine. This information is not intended to replace advice given to you by your health care provider. Make sure you discuss any questions you have with your health care provider. Document Released: 12/04/2003 Document Revised: 01/14/2016 Document Reviewed: 07/03/2015 Elsevier Interactive Patient Education  2018 [employer]  Monitored Anesthesia Care Anesthesia is a term that refers to techniques, procedures, and medicines that help a person stay safe and comfortable  during a medical procedure. Monitored anesthesia care, or sedation, is one type of anesthesia. Your anesthesia specialist may recommend sedation if you will be having a procedure that does not require you to be unconscious, such as:  Cataract surgery.  A dental procedure.  A biopsy.  A colonoscopy.  During the procedure, you may receive a medicine to help you relax (sedative). There are three levels of sedation:  Mild sedation. At this level, you may feel awake and relaxed. You will be able to follow directions.  Moderate sedation. At this level, you will be sleepy. You may not remember the procedure.  Deep sedation. At this level, you will be asleep. You will not remember the procedure.  The more medicine you are given, the deeper your level of sedation will be. Depending on how you respond to the procedure, the anesthesia specialist may change your level of sedation or the type of anesthesia to fit your needs. An anesthesia specialist will monitor you closely during the procedure. Let your health care provider know about:  Any allergies you have.  All medicines you are taking, including vitamins, herbs, eye drops, creams, and over-the-counter medicines.  Any use of steroids (by mouth or as a cream).  Any problems you or family members have had with sedatives and anesthetic medicines.  Any blood disorders you have.  Any surgeries you have had.  Any medical conditions you have, such as sleep apnea.  Whether you are pregnant or may be pregnant.  Any use of cigarettes, alcohol, or street drugs. What are the risks? Generally, this is a safe procedure. However, problems may occur, including:  Getting too much medicine (oversedation).  Nausea.  Allergic reaction to medicines.  Trouble breathing. If this happens, a breathing tube may be used to help with breathing. It will be removed when you are awake and breathing on your own.  Heart trouble.  Lung trouble.  Before  the procedure Staying hydrated Follow instructions from your health care provider about hydration, which may include:  Up to 2 hours before the procedure - you may continue to drink clear liquids, such as water, clear fruit juice, black coffee, and plain tea.  Eating and drinking restrictions Follow instructions from your health care provider  about eating and drinking, which may include:  8 hours before the procedure - stop eating heavy meals or foods such as meat, fried foods, or fatty foods.  6 hours before the procedure - stop eating light meals or foods, such as toast or cereal.  6 hours before the procedure - stop drinking milk or drinks that contain milk.  2 hours before the procedure - stop drinking clear liquids.  Medicines Ask your health care provider about:  Changing or stopping your regular medicines. This is especially important if you are taking diabetes medicines or blood thinners.  Taking medicines such as aspirin and ibuprofen. These medicines can thin your blood. Do not take these medicines before your procedure if your health care provider instructs you not to.  Tests and exams  You will have a physical exam.  You may have blood tests done to show: ? How well your kidneys and liver are working. ? How well your blood can clot.  General instructions  Plan to have someone take you home from the hospital or clinic.  If you will be going home right after the procedure, plan to have someone with you for 24 hours.  What happens during the procedure?  Your blood pressure, heart rate, breathing, level of pain and overall condition will be monitored.  An IV tube will be inserted into one of your veins.  Your anesthesia specialist will give you medicines as needed to keep you comfortable during the procedure. This may mean changing the level of sedation.  The procedure will be performed. After the procedure  Your blood pressure, heart rate, breathing rate, and  blood oxygen level will be monitored until the medicines you were given have worn off.  Do not drive for 24 hours if you received a sedative.  You may: ? Feel sleepy, clumsy, or nauseous. ? Feel forgetful about what happened after the procedure. ? Have a sore throat if you had a breathing tube during the procedure. ? Vomit. This information is not intended to replace advice given to you by your health care provider. Make sure you discuss any questions you have with your health care provider. Document Released: 01/15/2005 Document Revised: 09/28/2015 Document Reviewed: 08/12/2015 Elsevier Interactive Patient Education  2018 [employer] Monitored Anesthesia Care, Care After These instructions provide you with information about caring for yourself after your procedure. Your health care provider may also give you more specific instructions. Your treatment has been planned according to current medical practices, but problems sometimes occur. Call your health care provider if you have any problems or questions after your procedure. What can I expect after the procedure? After your procedure, it is common to:  Feel sleepy for several hours.  Feel clumsy and have poor balance for several hours.  Feel forgetful about what happened after the procedure.  Have poor judgment for several hours.  Feel nauseous or vomit.  Have a sore throat if you had a breathing tube during the procedure.  Follow these instructions at home: For at least 24 hours after the procedure:   Do not: ? Participate in activities in which you could fall or become injured. ? Drive. ? Use heavy machinery. ? Drink alcohol. ? Take sleeping pills or medicines that cause drowsiness. ? Make important decisions or sign legal documents. ? Take care of children on your own.  Rest. Eating and drinking  Follow the diet that is recommended by your health care provider.  If you vomit, drink water, juice, or  soup when you  can drink without vomiting.  Make sure you have little or no nausea before eating solid foods. General instructions  Have a responsible adult stay with you until you are awake and alert.  Take over-the-counter and prescription medicines only as told by your health care provider.  If you smoke, do not smoke without supervision.  Keep all follow-up visits as told by your health care provider. This is important. Contact a health care provider if:  You keep feeling nauseous or you keep vomiting.  You feel light-headed.  You develop a rash.  You have a fever. Get help right away if:  You have trouble breathing. This information is not intended to replace advice given to you by your health care provider. Make sure you discuss any questions you have with your health care provider. Document Released: 08/12/2015 Document Revised: 12/12/2015 Document Reviewed: 08/12/2015 Elsevier Interactive Patient Education  [employer].

## 2017-08-17 ENCOUNTER — Encounter (HOSPITAL_COMMUNITY)
Admission: RE | Admit: 2017-08-17 | Discharge: 2017-08-17 | Disposition: A | Discharge status: Home / Self Care | Payer: Medicare Other | Source: Ambulatory Visit | Attending: Internal Medicine | Admitting: Internal Medicine

## 2017-08-17 ENCOUNTER — Encounter (HOSPITAL_COMMUNITY): Payer: Self-pay

## 2017-08-17 ENCOUNTER — Other Ambulatory Visit: Payer: Self-pay

## 2017-08-17 DIAGNOSIS — Z01812 Encounter for preprocedural laboratory examination: Secondary | ICD-10-CM | POA: Diagnosis not present

## 2017-08-17 DIAGNOSIS — Z01810 Encounter for preprocedural cardiovascular examination: Secondary | ICD-10-CM | POA: Diagnosis not present

## 2017-08-17 HISTORY — DX: Major depressive disorder, single episode, unspecified: F32.9

## 2017-08-17 HISTORY — DX: Depression, unspecified: F32.A

## 2017-08-17 HISTORY — DX: Cardiac murmur, unspecified: R01.1

## 2017-08-17 HISTORY — DX: Anxiety disorder, unspecified: F41.9

## 2017-08-17 LAB — BASIC METABOLIC PANEL
ANION GAP: 13 (ref 5–15)
BUN: 17 mg/dL (ref 6–20)
CALCIUM: 9.4 mg/dL (ref 8.9–10.3)
CO2: 24 mmol/L (ref 22–32)
Chloride: 102 mmol/L (ref 101–111)
Creatinine, Ser: 1.1 mg/dL — ABNORMAL HIGH (ref 0.44–1.00)
GFR, EST AFRICAN AMERICAN: 58 mL/min — AB (ref >60)
GFR, EST NON AFRICAN AMERICAN: 50 mL/min — AB (ref >60)
Glucose, Bld: 94 mg/dL (ref 65–99)
Potassium: 4.1 mmol/L (ref 3.5–5.1)
SODIUM: 139 mmol/L (ref 135–145)

## 2017-08-17 LAB — CBC WITH DIFFERENTIAL/PLATELET
BASOS ABS: 0 10*3/uL (ref 0.0–0.1)
BASOS PCT: 0 %
EOS PCT: 3 %
Eosinophils Absolute: 0.2 10*3/uL (ref 0.0–0.7)
HEMATOCRIT: 40.3 % (ref 36.0–46.0)
Hemoglobin: 12.3 g/dL (ref 12.0–15.0)
Lymphocytes Relative: 22 %
Lymphs Abs: 1.7 10*3/uL (ref 0.7–4.0)
MCH: 26.6 pg (ref 26.0–34.0)
MCHC: 30.5 g/dL (ref 30.0–36.0)
MCV: 87.2 fL (ref 78.0–100.0)
MONO ABS: 0.5 10*3/uL (ref 0.1–1.0)
Monocytes Relative: 7 %
NEUTROS ABS: 5.2 10*3/uL (ref 1.7–7.7)
Neutrophils Relative %: 68 %
PLATELETS: 282 10*3/uL (ref 150–400)
RBC: 4.62 MIL/uL (ref 3.87–5.11)
RDW: 13.8 % (ref 11.5–15.5)
WBC: 7.6 10*3/uL (ref 4.0–10.5)

## 2017-08-17 NOTE — Progress Notes (Signed)
   08/17/17 1402  OBSTRUCTIVE SLEEP APNEA  Have you ever been diagnosed with sleep apnea through a sleep study? No  Do you snore loudly (loud enough to be heard through closed doors)?  1  Do you often feel tired, fatigued, or sleepy during the daytime (such as falling asleep during driving or talking to someone)? 0  Has anyone observed you stop breathing during your sleep? 0  Do you have, or are you being treated for high blood pressure? 1  BMI more than 35 kg/m2? 1  Age > 50 (1-yes) 1  Neck circumference greater than:Female 16 inches or larger, Female 17inches or larger? 0  Female Gender (Yes=1) 0  Obstructive Sleep Apnea Score 4   

## 2017-08-17 NOTE — Progress Notes (Signed)
   08/17/17 1402  OBSTRUCTIVE SLEEP APNEA  Have you ever been diagnosed with sleep apnea through a sleep study? No  Do you snore loudly (loud enough to be heard through closed doors)?  1  Do you often feel tired, fatigued, or sleepy during the daytime (such as falling asleep during driving or talking to someone)? 0  Has anyone observed you stop breathing during your sleep? 0  Do you have, or are you being treated for high blood pressure? 1  BMI more than 35 kg/m2? 1  Age > 50 (1-yes) 1  Neck circumference greater than:Female 16 inches or larger, Female 17inches or larger? 0  Female Gender (Yes=1) 0  Obstructive Sleep Apnea Score 4

## 2017-08-24 ENCOUNTER — Ambulatory Visit (HOSPITAL_COMMUNITY)
Admission: RE | Admit: 2017-08-24 | Discharge: 2017-08-24 | Disposition: A | Discharge status: Home / Self Care | Payer: Medicare Other | Source: Ambulatory Visit | Attending: Internal Medicine | Admitting: Internal Medicine

## 2017-08-24 ENCOUNTER — Encounter (HOSPITAL_COMMUNITY): Payer: Self-pay | Admitting: *Deleted

## 2017-08-24 ENCOUNTER — Encounter (HOSPITAL_COMMUNITY)
Admission: RE | Disposition: A | Discharge status: Home / Self Care | Payer: Self-pay | Source: Ambulatory Visit | Attending: Internal Medicine

## 2017-08-24 ENCOUNTER — Ambulatory Visit (HOSPITAL_COMMUNITY): Payer: Medicare Other | Admitting: Anesthesiology

## 2017-08-24 DIAGNOSIS — Z841 Family history of disorders of kidney and ureter: Secondary | ICD-10-CM | POA: Diagnosis not present

## 2017-08-24 DIAGNOSIS — Z90710 Acquired absence of both cervix and uterus: Secondary | ICD-10-CM | POA: Diagnosis not present

## 2017-08-24 DIAGNOSIS — Z91040 Latex allergy status: Secondary | ICD-10-CM | POA: Diagnosis not present

## 2017-08-24 DIAGNOSIS — M5410 Radiculopathy, site unspecified: Secondary | ICD-10-CM | POA: Diagnosis not present

## 2017-08-24 DIAGNOSIS — Z820 Family history of epilepsy and other diseases of the nervous system: Secondary | ICD-10-CM | POA: Diagnosis not present

## 2017-08-24 DIAGNOSIS — Z8371 Family history of colonic polyps: Secondary | ICD-10-CM | POA: Insufficient documentation

## 2017-08-24 DIAGNOSIS — M79602 Pain in left arm: Secondary | ICD-10-CM | POA: Insufficient documentation

## 2017-08-24 DIAGNOSIS — M25569 Pain in unspecified knee: Secondary | ICD-10-CM | POA: Diagnosis not present

## 2017-08-24 DIAGNOSIS — Z7951 Long term (current) use of inhaled steroids: Secondary | ICD-10-CM | POA: Diagnosis not present

## 2017-08-24 DIAGNOSIS — Z833 Family history of diabetes mellitus: Secondary | ICD-10-CM | POA: Insufficient documentation

## 2017-08-24 DIAGNOSIS — I10 Essential (primary) hypertension: Secondary | ICD-10-CM | POA: Insufficient documentation

## 2017-08-24 DIAGNOSIS — K5730 Diverticulosis of large intestine without perforation or abscess without bleeding: Secondary | ICD-10-CM | POA: Diagnosis not present

## 2017-08-24 DIAGNOSIS — Z8261 Family history of arthritis: Secondary | ICD-10-CM | POA: Insufficient documentation

## 2017-08-24 DIAGNOSIS — M25572 Pain in left ankle and joints of left foot: Secondary | ICD-10-CM | POA: Diagnosis not present

## 2017-08-24 DIAGNOSIS — F418 Other specified anxiety disorders: Secondary | ICD-10-CM | POA: Insufficient documentation

## 2017-08-24 DIAGNOSIS — Z79899 Other long term (current) drug therapy: Secondary | ICD-10-CM | POA: Insufficient documentation

## 2017-08-24 DIAGNOSIS — M545 Low back pain: Secondary | ICD-10-CM | POA: Insufficient documentation

## 2017-08-24 DIAGNOSIS — G8929 Other chronic pain: Secondary | ICD-10-CM | POA: Diagnosis not present

## 2017-08-24 DIAGNOSIS — Z8249 Family history of ischemic heart disease and other diseases of the circulatory system: Secondary | ICD-10-CM | POA: Insufficient documentation

## 2017-08-24 DIAGNOSIS — Z1211 Encounter for screening for malignant neoplasm of colon: Secondary | ICD-10-CM | POA: Diagnosis not present

## 2017-08-24 DIAGNOSIS — Z1212 Encounter for screening for malignant neoplasm of rectum: Secondary | ICD-10-CM | POA: Diagnosis not present

## 2017-08-24 DIAGNOSIS — M1990 Unspecified osteoarthritis, unspecified site: Secondary | ICD-10-CM | POA: Diagnosis not present

## 2017-08-24 DIAGNOSIS — M5030 Other cervical disc degeneration, unspecified cervical region: Secondary | ICD-10-CM | POA: Diagnosis not present

## 2017-08-24 HISTORY — PX: COLONOSCOPY WITH PROPOFOL: SHX5780

## 2017-08-24 SURGERY — COLONOSCOPY WITH PROPOFOL
Anesthesia: General

## 2017-08-24 MED ORDER — LACTATED RINGERS IV SOLN
INTRAVENOUS | Status: DC
  Administered 2017-08-24: 09:00:00 via INTRAVENOUS

## 2017-08-24 MED ORDER — PROPOFOL 500 MG/50ML IV EMUL
INTRAVENOUS | Status: DC | PRN
  Administered 2017-08-24: 150 ug/kg/min via INTRAVENOUS

## 2017-08-24 NOTE — Anesthesia Postprocedure Evaluation (Signed)
Anesthesia Post Note  Patient: Abigail Montgomery  Procedure(s) Performed: COLONOSCOPY WITH PROPOFOL (N/A )  Patient location during evaluation: PACU Anesthesia Type: General Level of consciousness: awake and alert, oriented and patient cooperative Pain management: pain level controlled Vital Signs Assessment: post-procedure vital signs reviewed and stable Respiratory status: spontaneous breathing and respiratory function stable Cardiovascular status: stable Postop Assessment: no apparent nausea or vomiting Anesthetic complications: no     Last Vitals:  Vitals:   08/24/17 0842  BP: (!) 141/69  Pulse: 68  Resp: 15  Temp: 36.8 C  SpO2: 99%    Last Pain:  Vitals:   08/24/17 0842  TempSrc: Oral  PainSc:                  ADAMS, AMY A

## 2017-08-24 NOTE — Interval H&P Note (Signed)
History and Physical Interval Note:  08/24/2017 8:56 AM  Abigail MendGeraldine R Pollina  has presented today for surgery, with the diagnosis of colon cancer screening  The various methods of treatment have been discussed with the patient and family. After consideration of risks, benefits and other options for treatment, the patient has consented to  Procedure(s) with comments: COLONOSCOPY WITH PROPOFOL (N/A) - 8:45am as a surgical intervention .  The patient's history has been reviewed, patient examined, no change in status, stable for surgery.  I have reviewed the patient's chart and labs.  Questions were answered to the patient's satisfaction.     Cabrini Ruggieri  No change. Intermittent right lower quadrant abdominal pain. Colonoscopy per plan.  The risks, benefits, limitations, alternatives and imponderables have been reviewed with the patient. Questions have been answered. All parties are agreeable.

## 2017-08-24 NOTE — Anesthesia Preprocedure Evaluation (Signed)
Anesthesia Evaluation  Patient identified by MRN, date of birth, ID band Patient awake    Reviewed: Allergy & Precautions, H&P , NPO status , Patient's Chart, lab work & pertinent test results  History of Anesthesia Complications (+) PONV and history of anesthetic complications  Airway Mallampati: II  TM Distance: >3 FB Neck ROM: full    Dental no notable dental hx.    Pulmonary neg pulmonary ROS,    Pulmonary exam normal breath sounds clear to auscultation       Cardiovascular Exercise Tolerance: Good hypertension, negative cardio ROS  + Valvular Problems/Murmurs  Rhythm:regular Rate:Normal     Neuro/Psych Anxiety Depression  Neuromuscular disease negative neurological ROS  negative psych ROS   GI/Hepatic negative GI ROS, Neg liver ROS,   Endo/Other  negative endocrine ROS  Renal/GU negative Renal ROS  negative genitourinary   Musculoskeletal   Abdominal   Peds  Hematology negative hematology ROS (+)   Anesthesia Other Findings Wt:  134.3 kg (296 lb) BMI:  50.8  Reproductive/Obstetrics negative OB ROS                             Anesthesia Physical Anesthesia Plan  ASA: III  Anesthesia Plan: General   Post-op Pain Management:    Induction:   PONV Risk Score and Plan:   Airway Management Planned:   Additional Equipment:   Intra-op Plan:   Post-operative Plan:   Informed Consent: I have reviewed the patients History and Physical, chart, labs and discussed the procedure including the risks, benefits and alternatives for the proposed anesthesia with the patient or authorized representative who has indicated his/her understanding and acceptance.   Dental Advisory Given  Plan Discussed with: CRNA  Anesthesia Plan Comments:         Anesthesia Quick Evaluation

## 2017-08-24 NOTE — Anesthesia Procedure Notes (Signed)
Procedure Name: MAC Date/Time: 08/24/2017 9:03 AM Performed by: Andree Elk Ellie Spickler A, CRNA Pre-anesthesia Checklist: Patient identified, Emergency Drugs available, Suction available, Patient being monitored and Timeout performed Patient Re-evaluated:Patient Re-evaluated prior to induction Oxygen Delivery Method: Simple face mask

## 2017-08-24 NOTE — Op Note (Signed)
[facility] Patient Name: Abigail SpinnerGeraldine Raisch Procedure Date: 08/24/2017 9:01 AM MRN: [id number] Date of Birth: January 09, 1951 Attending MD: Gennette Pacobert Michael Raechel Marcos , MD CSN: [id number] Age: 67 Admit Type: Outpatient Procedure:                Colonoscopy Indications:              Screening for colorectal malignant neoplasm Providers:                Gennette Pacobert Michael Sparkle Aube, MD, Jannett CelestineAnitra Bell, RN, Edythe ClarityKelly                            Cox, Technician Referring MD:             Kathleene HazelJohn Z. Hall MD Medicines:                Propofol per Anesthesia Complications:            No immediate complications. Estimated Blood Loss:      Procedure:                Pre-Anesthesia Assessment:                           - Prior to the procedure, a History and Physical                            was performed, and patient medications and                            allergies were reviewed. The patient's tolerance of                            previous anesthesia was also reviewed. The risks                            and benefits of the procedure and the sedation                            options and risks were discussed with the patient.                            All questions were answered, and informed consent                            was obtained. Prior Anticoagulants: The patient has                            taken no previous anticoagulant or antiplatelet                            agents. ASA Grade Assessment: III - A patient with                            severe systemic disease. After reviewing the risks  and benefits, the patient was deemed in                            satisfactory condition to undergo the procedure.                           After obtaining informed consent, the colonoscope                            was passed under direct vision. Throughout the                            procedure, the patient's blood pressure, pulse, and                            oxygen  saturations were monitored continuously. The                            EC-[id number] ([id number]) scope was introduced through                            the and advanced to the 3 cm into the ileum. The                            colonoscopy was performed without difficulty. The                            patient tolerated the procedure well. The quality                            of the bowel preparation was adequate. The terminal                            ileum, ileocecal valve, appendiceal orifice, and                            rectum were photographed. The entire colon was well                            visualized. Scope In: 9:09:26 AM Scope Out: 9:19:37 AM Scope Withdrawal Time: 0 hours 7 minutes 28 seconds  Total Procedure Duration: 0 hours 10 minutes 11 seconds  Findings:      The perianal and digital rectal examinations were normal. distal 3 cm of       terminal ileum mucosa appeared normal.      A few medium-mouthed diverticula were found in the entire colon.      The exam was otherwise without abnormality on direct and retroflexion       views. Impression:               - Diverticulosis in the entire examined colon.                           - The examination was otherwise normal on direct  and retroflexion views.                           - No specimens collected. no explanation for                            abdominal pain found on today's examination. Moderate Sedation:      Moderate (conscious) sedation was personally administered by an       anesthesia professional. The following parameters were monitored: oxygen       saturation, heart rate, blood pressure, respiratory rate, EKG, adequacy       of pulmonary ventilation, and response to care. Total physician       intraservice time was 17 minutes. Recommendation:           - Patient has a contact number available for                            emergencies. The signs and symptoms of potential                             delayed complications were discussed with the                            patient. Return to normal activities tomorrow.                            Written discharge instructions were provided to the                            patient.                           - Resume previous diet.                           - Continue present medications.                           - Repeat colonoscopy in 10 years for screening                            purposes.                           - Return to GI office in 2 months. May or may not                            need further evaluation of abdominal pain via CT                            scanning. We'll reassess when she returns in 2                            months Procedure Code(s):        --- Professional ---  16109, Colonoscopy, flexible; diagnostic, including                            collection of specimen(s) by brushing or washing,                            when performed (separate procedure) Diagnosis Code(s):        --- Professional ---                           Z12.11, Encounter for screening for malignant                            neoplasm of colon                           K57.30, Diverticulosis of large intestine without                            perforation or abscess without bleeding CPT copyright 2017 American Medical Association. All rights reserved. The codes documented in this report are preliminary and upon coder review may  be revised to meet current compliance requirements. Gerrit Friends. Dora Clauss, MD Gennette Pac, MD 08/24/2017 9:29:23 AM This report has been signed electronically. Number of Addenda: 0

## 2017-08-24 NOTE — Discharge Instructions (Signed)
Colonoscopy Discharge Instructions  Read the instructions outlined below and refer to this sheet in the next few weeks. These discharge instructions provide you with general information on caring for yourself after you leave the hospital. Your doctor may also give you specific instructions. While your treatment has been planned according to the most current medical practices available, unavoidable complications occasionally occur. If you have any problems or questions after discharge, call Dr. Jena Gaussourk at [phone]. ACTIVITY  You may resume your regular activity, but move at a slower pace for the next 24 hours.   Take frequent rest periods for the next 24 hours.   Walking will help get rid of the air and reduce the bloated feeling in your belly (abdomen).   No driving for 24 hours (because of the medicine (anesthesia) used during the test).    Do not sign any important legal documents or operate any machinery for 24 hours (because of the anesthesia used during the test).  NUTRITION  Drink plenty of fluids.   You may resume your normal diet as instructed by your doctor.   Begin with a light meal and progress to your normal diet. Heavy or fried foods are harder to digest and may make you feel sick to your stomach (nauseated).   Avoid alcoholic beverages for 24 hours or as instructed.  MEDICATIONS  You may resume your normal medications unless your doctor tells you otherwise.  WHAT YOU CAN EXPECT TODAY  Some feelings of bloating in the abdomen.   Passage of more gas than usual.   Spotting of blood in your stool or on the toilet paper.  IF YOU HAD POLYPS REMOVED DURING THE COLONOSCOPY:  No aspirin products for 7 days or as instructed.   No alcohol for 7 days or as instructed.   Eat a soft diet for the next 24 hours.  FINDING OUT THE RESULTS OF YOUR TEST Not all test results are available during your visit. If your test results are not back during the visit, make an appointment  with your caregiver to find out the results. Do not assume everything is normal if you have not heard from your caregiver or the medical facility. It is important for you to follow up on all of your test results.  SEEK IMMEDIATE MEDICAL ATTENTION IF:  You have more than a spotting of blood in your stool.   Your belly is swollen (abdominal distention).   You are nauseated or vomiting.   You have a temperature over 101.   You have abdominal pain or discomfort that is severe or gets worse throughout the day.    Diverticulosis information provided  Repeat colonoscopy in 10 years for screening purposes  Office visit with us in 2 months.  You may or may not need a CAT scan to further evaluate your chronic abdominal pain.  Patient may resume normal medications  Diverticulosis Diverticulosis is a condition that develops when small pouches (diverticula) form in the wall of the large intestine (colon). The colon is where water is absorbed and stool is formed. The pouches form when the inside layer of the colon pushes through weak spots in the outer layers of the colon. You may have a few pouches or many of them. What are the causes? The cause of this condition is not known. What increases the risk? The following factors may make you more likely to develop this condition:  Being older than age 69. Your risk for this condition increases with age. Diverticulosis is  rare among people younger than age 52. By age 56, many people have it.  Eating a low-fiber diet.  Having frequent constipation.  Being overweight.  Not getting enough exercise.  Smoking.  Taking over-the-counter pain medicines, like aspirin and ibuprofen.  Having a family history of diverticulosis.  What are the signs or symptoms? In most people, there are no symptoms of this condition. If you do have symptoms, they may include:  Bloating.  Cramps in the abdomen.  Constipation or diarrhea.  Pain in the lower left  side of the abdomen.  How is this diagnosed? This condition is most often diagnosed during an exam for other colon problems. Because diverticulosis usually has no symptoms, it often cannot be diagnosed independently. This condition may be diagnosed by:  Using a flexible scope to examine the colon (colonoscopy).  Taking an X-ray of the colon after dye has been put into the colon (barium enema).  Doing a CT scan.  How is this treated? You may not need treatment for this condition if you have never developed an infection related to diverticulosis. If you have had an infection before, treatment may include:  Eating a high-fiber diet. This may include eating more fruits, vegetables, and grains.  Taking a fiber supplement.  Taking a live bacteria supplement (probiotic).  Taking medicine to relax your colon.  Taking antibiotic medicines.  Follow these instructions at home:  Drink 6-8 glasses of water or more each day to prevent constipation.  Try not to strain when you have a bowel movement.  If you have had an infection before: ? Eat more fiber as directed by your health care provider or your diet and nutrition specialist (dietitian). ? Take a fiber supplement or probiotic, if your health care provider approves.  Take over-the-counter and prescription medicines only as told by your health care provider.  If you were prescribed an antibiotic, take it as told by your health care provider. Do not stop taking the antibiotic even if you start to feel better.  Keep all follow-up visits as told by your health care provider. This is important. Contact a health care provider if:  You have pain in your abdomen.  You have bloating.  You have cramps.  You have not had a bowel movement in 3 days. Get help right away if:  Your pain gets worse.  Your bloating becomes very bad.  You have a fever or chills, and your symptoms suddenly get worse.  You vomit.  You have bowel movements  that are bloody or black.  You have bleeding from your rectum. Summary  Diverticulosis is a condition that develops when small pouches (diverticula) form in the wall of the large intestine (colon).  You may have a few pouches or many of them.  This condition is most often diagnosed during an exam for other colon problems.  If you have had an infection related to diverticulosis, treatment may include increasing the fiber in your diet, taking supplements, or taking medicines. This information is not intended to replace advice given to you by your health care provider. Make sure you discuss any questions you have with your health care provider. Document Released: 01/17/2004 Document Revised: 03/10/2016 Document Reviewed: 03/10/2016 Elsevier Interactive Patient Education  2017 [employer]   PATIENT INSTRUCTIONS POST-ANESTHESIA  IMMEDIATELY FOLLOWING SURGERY:  Do not drive or operate machinery for the first twenty four hours after surgery.  Do not make any important decisions for twenty four hours after surgery or while taking narcotic  pain medications or sedatives.  If you develop intractable nausea and vomiting or a severe headache please notify your doctor immediately.  FOLLOW-UP:  Please make an appointment with your surgeon as instructed. You do not need to follow up with anesthesia unless specifically instructed to do so.  WOUND CARE INSTRUCTIONS (if applicable):  Keep a dry clean dressing on the anesthesia/puncture wound site if there is drainage.  Once the wound has quit draining you may leave it open to air.  Generally you should leave the bandage intact for twenty four hours unless there is drainage.  If the epidural site drains for more than 36-48 hours please call the anesthesia department.  QUESTIONS?:  Please feel free to call your physician or the hospital operator if you have any questions, and they will be happy to assist you.

## 2017-08-24 NOTE — Transfer of Care (Signed)
Immediate Anesthesia Transfer of Care Note  Patient: Abigail Montgomery  Procedure(s) Performed: COLONOSCOPY WITH PROPOFOL (N/A )  Patient Location: PACU  Anesthesia Type:MAC  Level of Consciousness: awake, alert , oriented and patient cooperative  Airway & Oxygen Therapy: Patient Spontanous Breathing  Post-op Assessment: Report given to RN and Post -op Vital signs reviewed and stable  Post vital signs: Reviewed and stable  Last Vitals:  Vitals Value Taken Time  BP    Temp    Pulse 73 08/24/2017  9:25 AM  Resp 16 08/24/2017  9:25 AM  SpO2 98 % 08/24/2017  9:25 AM  Vitals shown include unvalidated device data.  Last Pain:  Vitals:   08/24/17 0842  TempSrc: Oral  PainSc:       Patients Stated Pain Goal: 7 (37/48/27 0786)  Complications: No apparent anesthesia complications

## 2017-08-28 ENCOUNTER — Encounter (HOSPITAL_COMMUNITY): Payer: Self-pay | Admitting: Internal Medicine

## 2017-09-07 DIAGNOSIS — M545 Low back pain: Secondary | ICD-10-CM | POA: Diagnosis not present

## 2017-09-07 DIAGNOSIS — R1013 Epigastric pain: Secondary | ICD-10-CM | POA: Diagnosis not present

## 2017-09-23 DIAGNOSIS — R1013 Epigastric pain: Secondary | ICD-10-CM | POA: Diagnosis not present

## 2017-09-23 DIAGNOSIS — K219 Gastro-esophageal reflux disease without esophagitis: Secondary | ICD-10-CM | POA: Diagnosis not present

## 2017-10-07 ENCOUNTER — Ambulatory Visit: Payer: Medicare Other | Admitting: Obstetrics and Gynecology

## 2017-10-07 ENCOUNTER — Encounter: Payer: Self-pay | Admitting: Obstetrics and Gynecology

## 2017-10-07 VITALS — BP 157/73 | HR 81 | Ht 63.0 in | Wt 295.6 lb

## 2017-10-07 DIAGNOSIS — Z6841 Body Mass Index (BMI) 40.0 and over, adult: Secondary | ICD-10-CM

## 2017-10-07 DIAGNOSIS — G8929 Other chronic pain: Secondary | ICD-10-CM

## 2017-10-07 DIAGNOSIS — R1031 Right lower quadrant pain: Secondary | ICD-10-CM

## 2017-10-07 DIAGNOSIS — R102 Pelvic and perineal pain: Secondary | ICD-10-CM

## 2017-10-07 MED ORDER — ESTROGENS, CONJUGATED 0.625 MG/GM VA CREA: 1.0000 | TOPICAL_CREAM | VAGINAL | 2 refills | Status: DC

## 2017-10-07 NOTE — Progress Notes (Addendum)
[facility] Visit  @DATE @            Patient name: Abigail Montgomery MRN [id number]  Date of birth: Sep 26, 1950  CC & HPI:  Abigail Montgomery is a 67 y.o. female presenting today for right abdominal pain.. Patient notes that she is in her mind trying to work on her weight and [facility].  Weight remains unchanged from 10 years ago .she says there is pain when she gets in and out of the car and movement. She is also uncomfortable just sitting and the tenderness has been uncomfortable for months. Patient is also still sexually active. Lengthy discussion is made about the importance of weight loss as we humans age. ROS:  ROS +tenderness in abdomen   All systems are negative except as noted in the HPI and PMH.   Pertinent History Reviewed:   Reviewed: Significant for Medical         Past Medical History:  Diagnosis Date  . Abdominal pain   . Anxiety   . Arthritis   . Back pain   . Chronic knee pain   . Complication of anesthesia   . DDD (degenerative disc disease), cervical   . Depression   . Heart murmur   . Hypertension   . Knee pain   . Left ankle pain   . Left arm pain   . Low back pain   . Peripheral neuropathy   . PONV (postoperative nausea and vomiting)   . Radiculopathy                               Surgical Hx:    Past Surgical History:  Procedure Laterality Date  . ABDOMINAL HYSTERECTOMY    . COLONOSCOPY WITH PROPOFOL N/A 08/24/2017   Procedure: COLONOSCOPY WITH PROPOFOL;  Surgeon: Corbin Adeourk, Robert M, MD;  Location: [facility] SUITE;  Service: Endoscopy;  Laterality: N/A;  8:45am  . cyst removed from lower abdomen    . INCISIONAL HERNIA REPAIR N/A 08/18/2014   Procedure: Sherald HessINCISIONAL HERNIORRHAPHY WITH MESH;  Surgeon: Franky MachoMark Jenkins Md, MD;  Location: [facility] ORS;  Service: General;  Laterality: N/A;  . INSERTION OF MESH N/A 08/18/2014   Procedure: INSERTION OF MESH;  Surgeon: Franky MachoMark Jenkins Md, MD;  Location: [facility] ORS;  Service: General;  Laterality: N/A;   . TONSILECTOMY, ADENOIDECTOMY, BILATERAL MYRINGOTOMY AND TUBES    . UTERINE FIBROID SURGERY     Medications: Reviewed & Updated - see associated section                       Current Outpatient Medications:  .  albuterol (PROVENTIL HFA;VENTOLIN HFA) 108 (90 BASE) MCG/ACT inhaler, Inhale 1-2 puffs into the lungs every 6 (six) hours as needed for wheezing or shortness of breath. , Disp: , Rfl:  .  Ascorbic Acid (VITAMIN C) 1000 MG tablet, Take 1,000 mg by mouth daily as needed (Immune support). , Disp: , Rfl:  .  Cholecalciferol (VITAMIN D) 2000 units tablet, Take 2,000 Units by mouth once a week. , Disp: , Rfl:  .  citalopram (CELEXA) 20 MG tablet, Take 20 mg by mouth daily., Disp: , Rfl:  .  CRANBERRY PO, Take 1 tablet by mouth daily as needed (uti symptoms)., Disp: , Rfl:  .  furosemide (LASIX) 20 MG tablet, Take 20 mg by mouth daily as needed for fluid. , Disp: ,  Rfl:  .  lisinopril-hydrochlorothiazide (PRINZIDE,ZESTORETIC) 20-12.5 MG per tablet, Take 1 tablet by mouth daily., Disp: , Rfl:  .  Magnesium 250 MG TABS, Take 250 mg by mouth daily as needed (cramps). , Disp: , Rfl:  .  Menthol, Topical Analgesic, (ICY HOT EX), Apply 1 application topically daily as needed (pain)., Disp: , Rfl:  .  naproxen sodium (ALEVE) 220 MG tablet, Take 440 mg by mouth daily as needed (pain)., Disp: , Rfl:  .  omeprazole (PRILOSEC) 40 MG capsule, Take 40 mg by mouth daily. , Disp: , Rfl:  .  oxyCODONE (OXY IR/ROXICODONE) 5 MG immediate release tablet, Take 5 mg by mouth daily as needed for severe pain., Disp: , Rfl:  .  Polyvinyl Alcohol-Povidone (REFRESH OP), Place 1 drop into both eyes 3 (three) times daily., Disp: , Rfl:  .  Potassium 99 MG TABS, Take 99 mg by mouth daily as needed (cramps)., Disp: , Rfl:    Social History: Reviewed -  reports that she has never smoked. She has never used smokeless tobacco.  Objective Findings:  Vitals: Blood pressure (!) 157/73, pulse 81, height 5\' 3"  (1.6 m), weight  295 lb 9.6 oz (134.1 kg). Body mass index is 52.36 kg/m.   PHYSICAL EXAMINATION General appearance - alert, well appearing, and in no distress and oriented to person, place, and time Mental status - alert, oriented to person, place, and time, normal mood, behavior, speech, dress, motor activity, and thought processes, affect appropriate to mood.  PELVIC External genitalia - A bit of ligo on left labia majora but otherwise normal Vagina - vaginal support is good Cervix - surgically absent Uterus - surgically absent Adnexa - no appreciable masses   Assessment & Plan:   A:  1.  RLQ pain 2. Hx of right salipingo-oophorectomy 3. Morbid obesity BMI over 50 4. Per patient request Rx Premarin refill for vaginitis  P:  1. Order CT, pelvis and abdomen 2. Begin weight management 3. F/u up in 2 weeks 4.  Prescription for Premarin E scribed   By signing my name below, I, Arnette Norris, attest that this documentation has been prepared under the direction and in the presence of Tilda Burrow, MD. Electronically Signed: Arnette Norris Medical Scribe. 10/07/17. 8:53 AM.  I personally performed the services described in this documentation, which was SCRIBED in my presence. The recorded information has been reviewed and considered accurate. It has been edited as necessary during review. Tilda Burrow, MD

## 2017-10-08 LAB — CREATININE, SERUM
CREATININE: 1.14 mg/dL — AB (ref 0.57–1.00)
GFR calc Af Amer: 57 mL/min/{1.73_m2} — ABNORMAL LOW (ref >59)
GFR calc non Af Amer: 50 mL/min/{1.73_m2} — ABNORMAL LOW (ref >59)

## 2017-10-08 LAB — BUN: BUN: 13 mg/dL (ref 8–27)

## 2017-10-22 ENCOUNTER — Ambulatory Visit: Payer: Medicare Other | Admitting: Obstetrics and Gynecology

## 2017-10-26 ENCOUNTER — Encounter (HOSPITAL_COMMUNITY): Payer: Self-pay

## 2017-10-26 ENCOUNTER — Ambulatory Visit (HOSPITAL_COMMUNITY)
Admission: RE | Admit: 2017-10-26 | Discharge: 2017-10-26 | Disposition: A | Discharge status: Home / Self Care | Payer: Medicare Other | Source: Ambulatory Visit | Attending: Obstetrics and Gynecology | Admitting: Obstetrics and Gynecology

## 2017-10-26 DIAGNOSIS — Z90710 Acquired absence of both cervix and uterus: Secondary | ICD-10-CM | POA: Insufficient documentation

## 2017-10-26 DIAGNOSIS — R102 Pelvic and perineal pain: Secondary | ICD-10-CM | POA: Diagnosis not present

## 2017-10-26 DIAGNOSIS — R109 Unspecified abdominal pain: Secondary | ICD-10-CM | POA: Diagnosis not present

## 2017-10-26 MED ORDER — IOPAMIDOL (ISOVUE-300) INJECTION 61%
100.0000 mL | Freq: Once | INTRAVENOUS | Status: AC | PRN
  Administered 2017-10-26: 100 mL via INTRAVENOUS

## 2017-10-28 ENCOUNTER — Ambulatory Visit: Payer: Medicare Other | Admitting: Obstetrics and Gynecology

## 2017-10-28 ENCOUNTER — Encounter: Payer: Self-pay | Admitting: Obstetrics and Gynecology

## 2017-10-28 VITALS — BP 164/83 | HR 78 | Ht 63.0 in | Wt 296.6 lb

## 2017-10-28 DIAGNOSIS — G8929 Other chronic pain: Secondary | ICD-10-CM | POA: Diagnosis not present

## 2017-10-28 DIAGNOSIS — R1031 Right lower quadrant pain: Secondary | ICD-10-CM | POA: Diagnosis not present

## 2017-10-28 NOTE — Progress Notes (Signed)
Patient ID: DENAJA VERHOEVEN, female   DOB: May 26, 1950, 67 y.o.   MRN: [id number]    [facility] Clinic Visit  @DATE @            Patient name: Abigail Montgomery MRN [id number]  Date of birth: [birth year]  CC & HPI:  Abigail Montgomery is a 67 y.o. female presenting today to discuss the results of her CT on 10/26/2017 for chronic RLQ pain. Pt is trying to work on her weight by doing upper body exercises and biking. Her dream weight is 160, but it remains unchanged from 10 years ago. She uses Premarin for her vaginal discomfort. She had an abdominal hysterectomy. She has two children. Pt denies fever, chills, and any other symptoms and complaints at this time.   ROS:  ROS + chronic RLQ pain - fever - chills  All systems are negative except as noted in the HPI and PMH.   Pertinent History Reviewed:   Reviewed: Significant for abdominal pain and an abdominal hysterectomy. Medical         Past Medical History:  Diagnosis Date  . Abdominal pain   . Anxiety   . Arthritis   . Back pain   . Chronic knee pain   . Complication of anesthesia   . DDD (degenerative disc disease), cervical   . Depression   . Heart murmur   . Hypertension   . Knee pain   . Left ankle pain   . Left arm pain   . Low back pain   . Peripheral neuropathy   . PONV (postoperative nausea and vomiting)   . Radiculopathy                               Surgical Hx:    Past Surgical History:  Procedure Laterality Date  . ABDOMINAL HYSTERECTOMY    . COLONOSCOPY WITH PROPOFOL N/A 08/24/2017   Procedure: COLONOSCOPY WITH PROPOFOL;  Surgeon: Corbin Ade, MD;  Location: [facility];  Service: Endoscopy;  Laterality: N/A;  8:45am  . cyst removed from lower abdomen    . INCISIONAL HERNIA REPAIR N/A 08/18/2014   Procedure: Sherald Hess HERNIORRHAPHY WITH MESH;  Surgeon: Franky Macho Md, MD;  Location: [facility];  Service: General;  Laterality: N/A;  . INSERTION OF MESH N/A 08/18/2014   Procedure: INSERTION OF MESH;  Surgeon:  Franky Macho Md, MD;  Location: [facility];  Service: General;  Laterality: N/A;  . TONSILECTOMY, ADENOIDECTOMY, BILATERAL MYRINGOTOMY AND TUBES    . UTERINE FIBROID SURGERY     Medications: Reviewed & Updated - see associated section                       Current Outpatient Medications:  .  Cholecalciferol (VITAMIN D) 2000 units tablet, Take 2,000 Units by mouth once a week. , Disp: , Rfl:  .  citalopram (CELEXA) 20 MG tablet, Take 20 mg by mouth daily., Disp: , Rfl:  .  conjugated estrogens (PREMARIN) vaginal cream, Place 1 Applicatorful vaginally 3 (three) times a week. For vaginal thinning and irritation, Disp: 42.5 g, Rfl: 2 .  furosemide (LASIX) 20 MG tablet, Take 20 mg by mouth daily as needed for fluid. , Disp: , Rfl:  .  lisinopril-hydrochlorothiazide (PRINZIDE,ZESTORETIC) 20-12.5 MG per tablet, Take 1 tablet by mouth daily., Disp: , Rfl:  .  Menthol, Topical Analgesic, (ICY HOT EX), Apply 1 application topically daily  as needed (pain)., Disp: , Rfl:  .  naproxen sodium (ALEVE) 220 MG tablet, Take 440 mg by mouth daily as needed (pain)., Disp: , Rfl:  .  omeprazole (PRILOSEC) 40 MG capsule, Take 40 mg by mouth daily. , Disp: , Rfl:  .  oxyCODONE (OXY IR/ROXICODONE) 5 MG immediate release tablet, Take 5 mg by mouth daily as needed for severe pain., Disp: , Rfl:  .  Polyvinyl Alcohol-Povidone (REFRESH OP), Place 1 drop into both eyes 3 (three) times daily., Disp: , Rfl:  .  Potassium 99 MG TABS, Take 99 mg by mouth daily as needed (cramps)., Disp: , Rfl:  .  albuterol (PROVENTIL HFA;VENTOLIN HFA) 108 (90 BASE) MCG/ACT inhaler, Inhale 1-2 puffs into the lungs every 6 (six) hours as needed for wheezing or shortness of breath. , Disp: , Rfl:  .  Ascorbic Acid (VITAMIN C) 1000 MG tablet, Take 1,000 mg by mouth daily as needed (Immune support). , Disp: , Rfl:  .  CRANBERRY PO, Take 1 tablet by mouth daily as needed (uti symptoms)., Disp: , Rfl:  .  Magnesium 250 MG TABS, Take 250 mg by mouth daily  as needed (cramps). , Disp: , Rfl:    Social History: Reviewed -  reports that she has never smoked. She has never used smokeless tobacco.  Objective Findings:  Vitals: Blood pressure (!) 164/83, pulse 78, height 5\' 3"  (1.6 m), weight 296 lb 9.6 oz (134.5 kg).  PHYSICAL EXAMINATION General appearance - alert, well appearing, and in no distress, oriented to person, place, and time and overweight Mental status - alert, oriented to person, place, and time, normal mood, behavior, speech, dress, motor activity, and thought processes, affect appropriate to mood  PELVIC DEFERRED  Discussion: Discussed with pt weight loss methods including food measurement and calorie counting using apps such as MyNetDiary or My Fitness Pal. Recommended the use of measuring cups and spoons to monitor serving sizes. Encouraged adequate daily water intake, especially prior to meals to eliminate overeating. Additionally encouraged pt to become more active by taking daily walks of at least 30 minute duration, join a local gym such as the [facility] [employer] or attend water aerobics classes. Also advised pt to use pedometer on smartphone or utilize a smartband such as FitBit to keep track of daily activity.   At end of discussion, pt had opportunity to ask questions and has no further questions at this time.   Specific discussion of lifestyle changes, behavioral modifications and healthy eating habits as noted above. Greater than 50% was spent in counseling and coordination of care with the patient.  Total time greater than: 20 minutes.   Assessment & Plan:   A:  1. Chronic stable RLQ pain 2. Normal abdominal and pelvic CT, no hernia or mass, cannot rule out adhesions 3. Morbid obesity Body mass index is 52.54 kg/m.  P:  1. Improve Diet 2. Follow up prn if motivated to lose weight 3. Dietician Referral when motivated   By signing my name below, I, Pietro Cassismily Tufford, attest that this documentation has been prepared under the  direction and in the presence of Tilda BurrowFerguson, Jahmar Mckelvy V, MD. Electronically Signed: Pietro CassisEmily Tufford, Medical Scribe. 10/28/17. 9:04 AM.  I personally performed the services described in this documentation, which was SCRIBED in my presence. The recorded information has been reviewed and considered accurate. It has been edited as necessary during review. Tilda BurrowJohn V Edrian Melucci, MD

## 2017-10-28 NOTE — Patient Instructions (Signed)

## 2017-11-06 ENCOUNTER — Ambulatory Visit: Payer: Medicare Other | Admitting: Gastroenterology

## 2018-01-29 DIAGNOSIS — I10 Essential (primary) hypertension: Secondary | ICD-10-CM | POA: Diagnosis not present

## 2018-02-09 DIAGNOSIS — Z23 Encounter for immunization: Secondary | ICD-10-CM | POA: Diagnosis not present

## 2018-02-09 DIAGNOSIS — Z0000 Encounter for general adult medical examination without abnormal findings: Secondary | ICD-10-CM | POA: Diagnosis not present

## 2018-02-09 DIAGNOSIS — K219 Gastro-esophageal reflux disease without esophagitis: Secondary | ICD-10-CM | POA: Diagnosis not present

## 2018-02-09 DIAGNOSIS — I129 Hypertensive chronic kidney disease with stage 1 through stage 4 chronic kidney disease, or unspecified chronic kidney disease: Secondary | ICD-10-CM | POA: Diagnosis not present

## 2018-02-09 DIAGNOSIS — R600 Localized edema: Secondary | ICD-10-CM | POA: Diagnosis not present

## 2018-08-31 DIAGNOSIS — M545 Low back pain: Secondary | ICD-10-CM | POA: Diagnosis not present

## 2018-08-31 DIAGNOSIS — N183 Chronic kidney disease, stage 3 (moderate): Secondary | ICD-10-CM | POA: Diagnosis not present

## 2018-08-31 DIAGNOSIS — R600 Localized edema: Secondary | ICD-10-CM | POA: Diagnosis not present

## 2018-08-31 DIAGNOSIS — I10 Essential (primary) hypertension: Secondary | ICD-10-CM | POA: Diagnosis not present

## 2018-08-31 DIAGNOSIS — K219 Gastro-esophageal reflux disease without esophagitis: Secondary | ICD-10-CM | POA: Diagnosis not present

## 2018-09-10 DIAGNOSIS — Z0000 Encounter for general adult medical examination without abnormal findings: Secondary | ICD-10-CM | POA: Diagnosis not present

## 2018-09-29 DIAGNOSIS — M25552 Pain in left hip: Secondary | ICD-10-CM | POA: Diagnosis not present

## 2018-10-18 DIAGNOSIS — M542 Cervicalgia: Secondary | ICD-10-CM | POA: Diagnosis not present

## 2018-10-18 DIAGNOSIS — M1990 Unspecified osteoarthritis, unspecified site: Secondary | ICD-10-CM | POA: Diagnosis not present

## 2018-10-18 DIAGNOSIS — Z9181 History of falling: Secondary | ICD-10-CM | POA: Diagnosis not present

## 2018-10-18 DIAGNOSIS — M25511 Pain in right shoulder: Secondary | ICD-10-CM | POA: Diagnosis not present

## 2018-10-18 DIAGNOSIS — S161XXA Strain of muscle, fascia and tendon at neck level, initial encounter: Secondary | ICD-10-CM | POA: Diagnosis not present

## 2018-10-28 DIAGNOSIS — I10 Essential (primary) hypertension: Secondary | ICD-10-CM | POA: Diagnosis not present

## 2018-10-28 DIAGNOSIS — I129 Hypertensive chronic kidney disease with stage 1 through stage 4 chronic kidney disease, or unspecified chronic kidney disease: Secondary | ICD-10-CM | POA: Diagnosis not present

## 2018-10-28 DIAGNOSIS — K219 Gastro-esophageal reflux disease without esophagitis: Secondary | ICD-10-CM | POA: Diagnosis not present

## 2018-11-10 DIAGNOSIS — I129 Hypertensive chronic kidney disease with stage 1 through stage 4 chronic kidney disease, or unspecified chronic kidney disease: Secondary | ICD-10-CM | POA: Diagnosis not present

## 2018-11-10 DIAGNOSIS — I10 Essential (primary) hypertension: Secondary | ICD-10-CM | POA: Diagnosis not present

## 2018-11-10 DIAGNOSIS — K219 Gastro-esophageal reflux disease without esophagitis: Secondary | ICD-10-CM | POA: Diagnosis not present

## 2018-11-24 DIAGNOSIS — L720 Epidermal cyst: Secondary | ICD-10-CM | POA: Diagnosis not present

## 2018-12-20 DIAGNOSIS — I129 Hypertensive chronic kidney disease with stage 1 through stage 4 chronic kidney disease, or unspecified chronic kidney disease: Secondary | ICD-10-CM | POA: Diagnosis not present

## 2018-12-20 DIAGNOSIS — I10 Essential (primary) hypertension: Secondary | ICD-10-CM | POA: Diagnosis not present

## 2018-12-20 DIAGNOSIS — K219 Gastro-esophageal reflux disease without esophagitis: Secondary | ICD-10-CM | POA: Diagnosis not present

## 2018-12-29 ENCOUNTER — Encounter: Payer: Self-pay | Admitting: Gastroenterology

## 2018-12-29 NOTE — Progress Notes (Signed)
Referring Provider: Benita Stabile, MD Primary Care Physician:  Benita Stabile, MD Primary GI Physician: Dr. Jena Gauss  Chief Complaint  Patient presents with  . Anal Itching    and burning    HPI:   Abigail Montgomery is a 68 y.o. female presenting today for anal itching and burning.   Patient was last seen in our office on 08/04/17 for chronic RLQ pain and consideration of colonoscopy. Pain came from right flank and moved down to RLQ. No relation to meals or BMs. Worse with lifting/straining. Suspected to be more MSK related although adhesive disease was considered. Plans for screening colonoscopy. Colonoscopy on 08/24/17 with diverticulosis in the entire colon. Otherwise normal. Recommended repeat in 10 years.   CT abdomen and pelvis with contrast on 10/26/17 for right sided abdominal and pelvic pain without any acute findings or other significant abnormality.   Today she states she had a yeast infection in her vagina that started about 2 weeks ago. She also had itching at the anal opening and perianal itching/burning. She was treated with "little pink pills." Patient is not sure what the name of the medication was. Completed medication on August 23rd. Vaginal symptoms are better, but continues with anal/perianal itching and burning. Also feels like she has to wipe several times to get clean after a BM. States this has gotten worse. No prolapsing hemorrhoid tissue. No rectal bleeding. BMs daily. Soft and formed. No constipation. No sharp pain in rectum. Had diarrhea on Sunday after drinking hot chocolate. This has resolved. No black stools. Has chronic history of right lower quadrant/pelvic pain. This is unchanged. No other abdominal pain. No upper GI symptoms. Reflux is well controlled.   Past Medical History:  Diagnosis Date  . Abdominal pain   . Anxiety   . Arthritis   . Back pain   . Chronic knee pain   . Complication of anesthesia   . DDD (degenerative disc disease), cervical   .  Depression   . Heart murmur   . Hypertension   . Knee pain   . Left ankle pain   . Left arm pain   . Low back pain   . Peripheral neuropathy   . PONV (postoperative nausea and vomiting)   . Radiculopathy     Past Surgical History:  Procedure Laterality Date  . ABDOMINAL HYSTERECTOMY    . COLONOSCOPY WITH PROPOFOL N/A 08/24/2017   Diverticulosis in the entire colon. Otherwise normal. Repeat in 10 years.   . cyst removed from lower abdomen    . INCISIONAL HERNIA REPAIR N/A 08/18/2014   Procedure: Sherald Hess HERNIORRHAPHY WITH MESH;  Surgeon: Franky Macho Md, MD;  Location: [facility];  Service: General;  Laterality: N/A;  . INSERTION OF MESH N/A 08/18/2014   Procedure: INSERTION OF MESH;  Surgeon: Franky Macho Md, MD;  Location: [facility];  Service: General;  Laterality: N/A;  . TONSILECTOMY, ADENOIDECTOMY, BILATERAL MYRINGOTOMY AND TUBES    . UTERINE FIBROID SURGERY      Current Outpatient Medications  Medication Sig Dispense Refill  . albuterol (PROVENTIL HFA;VENTOLIN HFA) 108 (90 BASE) MCG/ACT inhaler Inhale 1-2 puffs into the lungs every 6 (six) hours as needed for wheezing or shortness of breath.     . Ascorbic Acid (VITAMIN C) 1000 MG tablet Take 1,000 mg by mouth daily as needed (Immune support).     . Cholecalciferol (VITAMIN D) 2000 units tablet Take 2,000 Units by mouth once a week.     Marland Kitchen  citalopram (CELEXA) 20 MG tablet Take 20 mg by mouth daily.    Marland Kitchen CRANBERRY PO Take 1 tablet by mouth daily as needed (uti symptoms).    . furosemide (LASIX) 20 MG tablet Take 20 mg by mouth daily as needed for fluid.     Marland Kitchen lisinopril-hydrochlorothiazide (PRINZIDE,ZESTORETIC) 20-12.5 MG per tablet Take 1 tablet by mouth daily.    . Magnesium 250 MG TABS Take 250 mg by mouth daily as needed (cramps).     . Menthol, Topical Analgesic, (ICY HOT EX) Apply 1 application topically daily as needed (pain).    . MYRBETRIQ 25 MG TB24 tablet Take 1 tablet by mouth daily.    . naproxen sodium (ALEVE) 220 MG  tablet Take 440 mg by mouth daily as needed (pain).    Marland Kitchen omeprazole (PRILOSEC) 40 MG capsule Take 40 mg by mouth daily.     Marland Kitchen oxyCODONE (OXY IR/ROXICODONE) 5 MG immediate release tablet Take 5 mg by mouth daily as needed for severe pain.    . Polyvinyl Alcohol-Povidone (REFRESH OP) Place 1 drop into both eyes 3 (three) times daily.    . Potassium 99 MG TABS Take 99 mg by mouth daily as needed (cramps).    . conjugated estrogens (PREMARIN) vaginal cream Place 1 Applicatorful vaginally 3 (three) times a week. For vaginal thinning and irritation (Patient not taking: Reported on 12/30/2018) 42.5 g 2   No current facility-administered medications for this visit.     Allergies as of 12/30/2018 - Review Complete 12/30/2018  Allergen Reaction Noted  . Latex Rash 02/27/2013    Family History  Problem Relation Age of Onset  . Heart disease Mother   . Parkinson's disease Father   . Colon polyps Father        >60  . Heart disease Unknown   . Arthritis Unknown   . Diabetes Unknown   . Kidney disease Unknown   . Colon cancer Neg Hx     Social History   Socioeconomic History  . Marital status: Married    Spouse name: Not on file  . Number of children: Not on file  . Years of education: Not on file  . Highest education level: Not on file  Occupational History  . Occupation: Scientist, research (physical sciences): unemployed  Social Needs  . Financial resource strain: Not on file  . Food insecurity    Worry: Not on file    Inability: Not on file  . Transportation needs    Medical: Not on file    Non-medical: Not on file  Tobacco Use  . Smoking status: Never Smoker  . Smokeless tobacco: Never Used  Substance and Sexual Activity  . Alcohol use: Yes    Comment: occ  . Drug use: No  . Sexual activity: Yes    Birth control/protection: Surgical    Comment: hyst  Lifestyle  . Physical activity    Days per week: Not on file    Minutes per session: Not on file  . Stress: Not on file  Relationships   . Social Herbalist on phone: Not on file    Gets together: Not on file    Attends religious service: Not on file    Active member of club or organization: Not on file    Attends meetings of clubs or organizations: Not on file    Relationship status: Not on file  Other Topics Concern  . Not on file  Social History Narrative  .  Not on file    Review of Systems: Gen: Denies fever, chills. CV: Denies chest pain, palpitations. She does have peripheral edema.  Resp: Denies dyspnea at rest. Admits to a chronic dry cough.  GI: See HPI Derm: See HPI Heme: See HPI  Physical Exam: BP (!) 178/84   Pulse 79   Temp (!) 96.8 F (36 C) (Temporal)   Ht 5\' 6"  (1.676 m)   Wt 285 lb 3.2 oz (129.4 kg)   BMI 46.03 kg/m  General:   Alert and oriented. No distress noted. Pleasant and cooperative.  Head:  Normocephalic and atraumatic. Eyes:  Conjuctiva clear without scleral icterus. Heart:  S1, S2 present without murmurs appreciated. Lungs:  Clear to auscultation bilaterally. No wheezes, rales, or rhonchi. No distress.  Abdomen:  +BS, soft, and non-distended. Minimal tenderness to palpation in the right lower quadrant. No rebound or guarding. No HSM or masses noted. Rectal: Some small perianal excoriations, small external skin tag, and small internal hemorrhoids. No pain on exam. No rectal bleeding.  Msk:  Symmetrical without gross deformities. Normal posture. Extremities:  With 1+ lower extremity edema. Neurologic:  Alert and  oriented x4 Psych: Normal mood and affect.

## 2018-12-30 ENCOUNTER — Encounter: Payer: Self-pay | Admitting: Gastroenterology

## 2018-12-30 ENCOUNTER — Other Ambulatory Visit: Payer: Self-pay

## 2018-12-30 ENCOUNTER — Ambulatory Visit: Payer: Medicare Other | Admitting: Gastroenterology

## 2018-12-30 VITALS — BP 178/84 | HR 79 | Temp 96.8°F | Ht 66.0 in | Wt 285.2 lb

## 2018-12-30 DIAGNOSIS — K6289 Other specified diseases of anus and rectum: Secondary | ICD-10-CM | POA: Diagnosis not present

## 2018-12-30 DIAGNOSIS — L290 Pruritus ani: Secondary | ICD-10-CM

## 2018-12-30 MED ORDER — HYDROCORTISONE (PERIANAL) 2.5 % EX CREA: 1.0000 "application " | TOPICAL_CREAM | Freq: Two times a day (BID) | CUTANEOUS | 1 refills | Status: AC

## 2018-12-30 NOTE — Assessment & Plan Note (Addendum)
68 y.o. female presenting with new onset anal itching and burning for the last 2 weeks. She notes she did have a vaginal yeast infection at the start of this and was treated with "pink pills." Completed medications on 12/26/18. Vaginal symptoms have resolved, but she continues to have external anal and perianal itching/burning. No sharp pain. Also notes she has to wipe a lot to get clean after BMs which has worsened. No brbpr. Rectal exam with some small perianal excoriations, small external skin tag, and small internal hemorrhoids. No pain on exam.   I feel patients symptoms of itching/burning are coming from perianal tinea. However, she also has hemorrhoids that may be influencing her symptoms. Advised she use OTC clotrimazole cream twice daily for the next 7 days. She is to call in 1 week to let us know how she is doing. Also prescribed Anusol cream for her hemorrhoids that she can use BID up to 7-10 days or PRN.  Follow-up in 6 months or before if needed.

## 2018-12-30 NOTE — Assessment & Plan Note (Signed)
Addressed under anal itching

## 2018-12-30 NOTE — Patient Instructions (Addendum)
1. Please use over the counter clotrimazole cream twice a day externally around your anus for the next 7 days. Call us back if you do not have improvement.   2. Be sure to keep your perianal area dry and clean.  3. I am sending in Anusol cream that you can all per your rectum for hemorrhoids. You can use twice a day for the next 7-10 days then as needed.   We can plan to see you back in 6 months.   Aliene Altes, PA-C [facility]

## 2019-01-04 DIAGNOSIS — I10 Essential (primary) hypertension: Secondary | ICD-10-CM | POA: Diagnosis not present

## 2019-01-04 DIAGNOSIS — K219 Gastro-esophageal reflux disease without esophagitis: Secondary | ICD-10-CM | POA: Diagnosis not present

## 2019-01-04 DIAGNOSIS — I129 Hypertensive chronic kidney disease with stage 1 through stage 4 chronic kidney disease, or unspecified chronic kidney disease: Secondary | ICD-10-CM | POA: Diagnosis not present

## 2019-01-17 DIAGNOSIS — Z23 Encounter for immunization: Secondary | ICD-10-CM | POA: Diagnosis not present

## 2019-01-24 DIAGNOSIS — I10 Essential (primary) hypertension: Secondary | ICD-10-CM | POA: Diagnosis not present

## 2019-02-01 ENCOUNTER — Other Ambulatory Visit (HOSPITAL_COMMUNITY): Payer: Self-pay | Admitting: Internal Medicine

## 2019-02-01 DIAGNOSIS — K219 Gastro-esophageal reflux disease without esophagitis: Secondary | ICD-10-CM | POA: Diagnosis not present

## 2019-02-01 DIAGNOSIS — R600 Localized edema: Secondary | ICD-10-CM | POA: Diagnosis not present

## 2019-02-01 DIAGNOSIS — Z1231 Encounter for screening mammogram for malignant neoplasm of breast: Secondary | ICD-10-CM

## 2019-02-01 DIAGNOSIS — N183 Chronic kidney disease, stage 3 (moderate): Secondary | ICD-10-CM | POA: Diagnosis not present

## 2019-02-01 DIAGNOSIS — I129 Hypertensive chronic kidney disease with stage 1 through stage 4 chronic kidney disease, or unspecified chronic kidney disease: Secondary | ICD-10-CM | POA: Diagnosis not present

## 2019-02-01 DIAGNOSIS — M545 Low back pain: Secondary | ICD-10-CM | POA: Diagnosis not present

## 2019-02-18 DIAGNOSIS — I10 Essential (primary) hypertension: Secondary | ICD-10-CM | POA: Diagnosis not present

## 2019-02-18 DIAGNOSIS — K219 Gastro-esophageal reflux disease without esophagitis: Secondary | ICD-10-CM | POA: Diagnosis not present

## 2019-02-18 DIAGNOSIS — I129 Hypertensive chronic kidney disease with stage 1 through stage 4 chronic kidney disease, or unspecified chronic kidney disease: Secondary | ICD-10-CM | POA: Diagnosis not present

## 2019-03-03 ENCOUNTER — Other Ambulatory Visit: Payer: Self-pay | Admitting: Obstetrics and Gynecology

## 2019-03-05 DIAGNOSIS — B372 Candidiasis of skin and nail: Secondary | ICD-10-CM | POA: Diagnosis not present

## 2019-03-05 DIAGNOSIS — I129 Hypertensive chronic kidney disease with stage 1 through stage 4 chronic kidney disease, or unspecified chronic kidney disease: Secondary | ICD-10-CM | POA: Diagnosis not present

## 2019-03-05 DIAGNOSIS — L723 Sebaceous cyst: Secondary | ICD-10-CM | POA: Diagnosis not present

## 2019-03-08 DIAGNOSIS — N644 Mastodynia: Secondary | ICD-10-CM | POA: Diagnosis not present

## 2019-03-08 DIAGNOSIS — L723 Sebaceous cyst: Secondary | ICD-10-CM | POA: Diagnosis not present

## 2019-03-09 ENCOUNTER — Emergency Department (HOSPITAL_COMMUNITY)
Admission: EM | Admit: 2019-03-09 | Discharge: 2019-03-10 | Disposition: A | Discharge status: Home / Self Care | Payer: Medicare Other | Attending: Emergency Medicine | Admitting: Emergency Medicine

## 2019-03-09 ENCOUNTER — Encounter (HOSPITAL_COMMUNITY): Payer: Self-pay | Admitting: *Deleted

## 2019-03-09 ENCOUNTER — Other Ambulatory Visit: Payer: Self-pay

## 2019-03-09 DIAGNOSIS — F419 Anxiety disorder, unspecified: Secondary | ICD-10-CM | POA: Insufficient documentation

## 2019-03-09 DIAGNOSIS — I4581 Long QT syndrome: Secondary | ICD-10-CM | POA: Diagnosis not present

## 2019-03-09 DIAGNOSIS — R9431 Abnormal electrocardiogram [ECG] [EKG]: Secondary | ICD-10-CM | POA: Diagnosis not present

## 2019-03-09 DIAGNOSIS — Z91040 Latex allergy status: Secondary | ICD-10-CM | POA: Insufficient documentation

## 2019-03-09 DIAGNOSIS — I10 Essential (primary) hypertension: Secondary | ICD-10-CM | POA: Insufficient documentation

## 2019-03-09 DIAGNOSIS — F29 Unspecified psychosis not due to a substance or known physiological condition: Secondary | ICD-10-CM | POA: Diagnosis not present

## 2019-03-09 DIAGNOSIS — N390 Urinary tract infection, site not specified: Secondary | ICD-10-CM | POA: Insufficient documentation

## 2019-03-09 DIAGNOSIS — Z79899 Other long term (current) drug therapy: Secondary | ICD-10-CM | POA: Diagnosis not present

## 2019-03-09 LAB — CBC WITH DIFFERENTIAL/PLATELET
Abs Immature Granulocytes: 0.04 10*3/uL (ref 0.00–0.07)
Basophils Absolute: 0.1 10*3/uL (ref 0.0–0.1)
Basophils Relative: 1 %
Eosinophils Absolute: 0.5 10*3/uL (ref 0.0–0.5)
Eosinophils Relative: 6 %
HCT: 42.2 % (ref 36.0–46.0)
Hemoglobin: 12.7 g/dL (ref 12.0–15.0)
Immature Granulocytes: 1 %
Lymphocytes Relative: 22 %
Lymphs Abs: 1.8 10*3/uL (ref 0.7–4.0)
MCH: 27.1 pg (ref 26.0–34.0)
MCHC: 30.1 g/dL (ref 30.0–36.0)
MCV: 90.2 fL (ref 80.0–100.0)
Monocytes Absolute: 0.8 10*3/uL (ref 0.1–1.0)
Monocytes Relative: 10 %
Neutro Abs: 5 10*3/uL (ref 1.7–7.7)
Neutrophils Relative %: 60 %
Platelets: 299 10*3/uL (ref 150–400)
RBC: 4.68 MIL/uL (ref 3.87–5.11)
RDW: 13.1 % (ref 11.5–15.5)
WBC: 8.2 10*3/uL (ref 4.0–10.5)
nRBC: 0 % (ref 0.0–0.2)

## 2019-03-09 LAB — BASIC METABOLIC PANEL
Anion gap: 13 (ref 5–15)
BUN: 21 mg/dL (ref 8–23)
CO2: 23 mmol/L (ref 22–32)
Calcium: 9.4 mg/dL (ref 8.9–10.3)
Chloride: 103 mmol/L (ref 98–111)
Creatinine, Ser: 1.18 mg/dL — ABNORMAL HIGH (ref 0.44–1.00)
GFR calc Af Amer: 54 mL/min — ABNORMAL LOW (ref >60)
GFR calc non Af Amer: 47 mL/min — ABNORMAL LOW (ref >60)
Glucose, Bld: 100 mg/dL — ABNORMAL HIGH (ref 70–99)
Potassium: 3.8 mmol/L (ref 3.5–5.1)
Sodium: 139 mmol/L (ref 135–145)

## 2019-03-09 MED ORDER — ZIPRASIDONE MESYLATE 20 MG IM SOLR
5.0000 mg | Freq: Once | INTRAMUSCULAR | Status: AC
  Administered 2019-03-09: 5 mg via INTRAMUSCULAR
  Filled 2019-03-09: qty 20

## 2019-03-09 NOTE — ED Triage Notes (Signed)
Pt in states, "I am cold." pts husband at the bedside stating, "our family has been through a lot this week and she has been having problems and not taking my medicine." pt reports having upcoming biopsy with concerns for breast cancer, pt states, "I want  a second opinion on my right Breast drainage that smells-the cyst on top of the breast smells." pt calm and cooperative in triage, A&O x4

## 2019-03-09 NOTE — ED Notes (Signed)
Family in room with patient

## 2019-03-09 NOTE — ED Notes (Signed)
RN to room to give pt water to drink to encourage urine sample. Pt threw water all over the room and her husband. Pt talking very loudly. Pt talking randomly, reading everything in front of her, (name tag, logos, items on wall) pt being uncooperative.   Water cleaned up. edp notified. Orders given.

## 2019-03-09 NOTE — ED Notes (Signed)
rn to room to give IM medication. Pt very untrusting of staff. Demanding husband to record and take pictures. Pt attempted to slap rn hand away. rn advised patient not to hit staff. Pt became complaint and allowed IM admin

## 2019-03-09 NOTE — ED Provider Notes (Signed)
[facility] EMERGENCY DEPARTMENT Provider Note   CSN: [id number] Arrival date & time: 03/09/19  1321     History   Chief Complaint Chief Complaint  Patient presents with  . Anxiety    HPI Abigail Montgomery is a 68 y.o. female.     HPI   Abigail Montgomery is a 68 y.o. female who presents to the Emergency Department with her husband.  Patient states that she has having difficulty with anxiety and stress related to family issues.  Patient's spouse states that she has had anxiety for several years, but has not been on medications recently.  Patient states that she used to take Haldol in the 80s.  Patient's spouse said that she has "not been acting right" for several weeks but worse this week.  She was seen at a local urgent care and given prescription for Vistaril and patient's spouse states the medication has not been helping her current symptoms.  He states she has been argumentative and combative at times.  Patient denies any suicidal homicidal thoughts or plans.  She denies any auditory or visual hallucinations.  Past Medical History:  Diagnosis Date  . Abdominal pain   . Anxiety   . Arthritis   . Back pain   . Chronic knee pain   . Complication of anesthesia   . DDD (degenerative disc disease), cervical   . Depression   . Heart murmur   . Hypertension   . Knee pain   . Left ankle pain   . Left arm pain   . Low back pain   . Peripheral neuropathy   . PONV (postoperative nausea and vomiting)   . Radiculopathy     Patient Active Problem List   Diagnosis Date Noted  . Anal itching 12/30/2018  . Anal burning 12/30/2018  . Chronic RLQ pain 08/04/2017  . Colon cancer screening 08/04/2017  . Incisional hernia 08/18/2014  . Arthritis of knee, degenerative 10/09/2010    Past Surgical History:  Procedure Laterality Date  . ABDOMINAL HYSTERECTOMY    . COLONOSCOPY WITH PROPOFOL N/A 08/24/2017   Diverticulosis in the entire colon. Otherwise normal. Repeat in 10 years.   .  cyst removed from lower abdomen    . INCISIONAL HERNIA REPAIR N/A 08/18/2014   Procedure: Sherald Hess HERNIORRHAPHY WITH MESH;  Surgeon: Franky Macho Md, MD;  Location: [facility];  Service: General;  Laterality: N/A;  . INSERTION OF MESH N/A 08/18/2014   Procedure: INSERTION OF MESH;  Surgeon: Franky Macho Md, MD;  Location: [facility];  Service: General;  Laterality: N/A;  . TONSILECTOMY, ADENOIDECTOMY, BILATERAL MYRINGOTOMY AND TUBES    . UTERINE FIBROID SURGERY       OB History    Gravida  2   Para  2   Term  2   Preterm      AB      Living  2     SAB      TAB      Ectopic      Multiple      Live Births  2            Home Medications    Prior to Admission medications   Medication Sig Start Date End Date Taking? Authorizing Provider  hydrOXYzine (VISTARIL) 50 MG capsule Take 50 mg by mouth every 6 (six) hours as needed for anxiety.   Yes [provider]  albuterol (PROVENTIL HFA;VENTOLIN HFA) 108 (90 BASE) MCG/ACT inhaler Inhale 1-2 puffs into the  lungs every 6 (six) hours as needed for wheezing or shortness of breath.     [provider]  Ascorbic Acid (VITAMIN C) 1000 MG tablet Take 1,000 mg by mouth daily as needed (Immune support).     [provider]  Cholecalciferol (VITAMIN D) 2000 units tablet Take 2,000 Units by mouth once a week.     [provider]  citalopram (CELEXA) 20 MG tablet Take 20 mg by mouth daily.    [provider]  conjugated estrogens (PREMARIN) vaginal cream Place vaginally 3 (three) times a week. Use 0.5 grams. Per treatment. 03/04/19   Jonnie Kind, MD  CRANBERRY PO Take 1 tablet by mouth daily as needed (uti symptoms).    [provider]  furosemide (LASIX) 20 MG tablet Take 20 mg by mouth daily as needed for fluid.     [provider]  hydrocortisone (ANUSOL-HC) 2.5 % rectal cream Place 1 application rectally 2 (two) times daily. 12/30/18   Erenest Rasher, PA-C   lisinopril-hydrochlorothiazide (PRINZIDE,ZESTORETIC) 20-12.5 MG per tablet Take 1 tablet by mouth daily.    [provider]  Magnesium 250 MG TABS Take 250 mg by mouth daily as needed (cramps).     [provider]  Menthol, Topical Analgesic, (ICY HOT EX) Apply 1 application topically daily as needed (pain).    [provider]  MYRBETRIQ 25 MG TB24 tablet Take 1 tablet by mouth daily. 11/08/18   [provider]  naproxen sodium (ALEVE) 220 MG tablet Take 440 mg by mouth daily as needed (pain).    [provider]  omeprazole (PRILOSEC) 40 MG capsule Take 40 mg by mouth daily.  10/01/17   [provider]  oxyCODONE (OXY IR/ROXICODONE) 5 MG immediate release tablet Take 5 mg by mouth daily as needed for severe pain.    [provider]  Polyvinyl Alcohol-Povidone (REFRESH OP) Place 1 drop into both eyes 3 (three) times daily.    [provider]  Potassium 99 MG TABS Take 99 mg by mouth daily as needed (cramps).    [provider]    Family History Family History  Problem Relation Age of Onset  . Heart disease Mother   . Parkinson's disease Father   . Colon polyps Father        >60  . Heart disease Other   . Arthritis Other   . Diabetes Other   . Kidney disease Other   . Colon cancer Neg Hx     Social History Social History   Tobacco Use  . Smoking status: Never Smoker  . Smokeless tobacco: Never Used  Substance Use Topics  . Alcohol use: Yes    Comment: occ  . Drug use: No     Allergies   Latex   Review of Systems Review of Systems  Constitutional: Negative for chills, fatigue and fever.  Respiratory: Negative for cough, shortness of breath and wheezing.   Cardiovascular: Negative for chest pain and palpitations.  Gastrointestinal: Negative for abdominal pain, nausea and vomiting.  Genitourinary: Negative for dysuria.  Musculoskeletal: Negative for arthralgias, back pain, neck pain and neck  stiffness.  Skin: Negative for rash.  Neurological: Negative for dizziness, speech difficulty, weakness, numbness and headaches.  Hematological: Does not bruise/bleed easily.  Psychiatric/Behavioral: Positive for agitation and behavioral problems. Negative for hallucinations, self-injury and suicidal ideas.     Physical Exam Updated Vital Signs BP (!) 159/52 (BP Location: Left Wrist)   Pulse 78  Temp (!) 97.5 F (36.4 C) (Oral)   Resp 16   Ht 5\' 4"  (1.626 m)   Wt 127.5 kg   SpO2 100%   BMI 48.23 kg/m   Physical Exam Vitals signs and nursing note reviewed.  Constitutional:      Appearance: Normal appearance.  HENT:     Mouth/Throat:     Mouth: Mucous membranes are moist.  Eyes:     Extraocular Movements: Extraocular movements intact.     Pupils: Pupils are equal, round, and reactive to light.  Neck:     Musculoskeletal: Normal range of motion.  Cardiovascular:     Rate and Rhythm: Normal rate and regular rhythm.     Pulses: Normal pulses.  Pulmonary:     Effort: Pulmonary effort is normal.     Breath sounds: Normal breath sounds.  Musculoskeletal: Normal range of motion.     Right lower leg: No edema.     Left lower leg: No edema.  Skin:    General: Skin is warm.     Findings: No rash.  Neurological:     General: No focal deficit present.     Mental Status: She is alert.     Sensory: No sensory deficit.     Motor: No weakness.  Psychiatric:        Mood and Affect: Mood is anxious.        Speech: Speech is tangential.        Behavior: Behavior is agitated and combative. Behavior is not aggressive.        Thought Content: Thought content does not include homicidal or suicidal ideation. Thought content does not include homicidal or suicidal plan.      ED Treatments / Results  Labs (all labs ordered are listed, but only abnormal results are displayed) Labs Reviewed  BASIC METABOLIC PANEL - Abnormal; Notable for the following components:      Result Value    Glucose, Bld 100 (*)    Creatinine, Ser 1.18 (*)    GFR calc non Af Amer 47 (*)    GFR calc Af Amer 54 (*)    All other components within normal limits  CBC WITH DIFFERENTIAL/PLATELET  URINALYSIS, ROUTINE W REFLEX MICROSCOPIC  RAPID URINE DRUG SCREEN, HOSP PERFORMED    EKG None  Radiology No results found.  Procedures Procedures (including critical care time)  Medications Ordered in ED Medications - No data to display   Initial Impression / Assessment and Plan / ED Course  I have reviewed the triage vital signs and the nursing notes.  Pertinent labs & imaging results that were available during my care of the patient were reviewed by me and considered in my medical decision making (see chart for details).        Pt also seen by Dr. Denton LankSteinl She denies SI, HI, auditory or visual hallucinations.  Patient argumentative and combative with husband during my exam.  Patient witnessed throwing water at her spouse.  She is having tangential speech.  Low clinical suspicion for infectious process.  Symptoms felt to be consistent with acute psychosis.  Will order Geodon, labs, and TTS consult.  TTS consult completed, patient meets criteria for admission.  Involuntary commitment papers filed.    Final Clinical Impressions(s) / ED Diagnoses   Final diagnoses:  None    ED Discharge Orders    None       Pauline Ausriplett, Laylonie Marzec, PA-C 03/10/19 0050    Mesner, Barbara CowerJason, MD 03/10/19 [id number]

## 2019-03-09 NOTE — ED Notes (Signed)
Pt asleep.

## 2019-03-09 NOTE — ED Notes (Signed)
bloodwork collected by lab

## 2019-03-09 NOTE — ED Notes (Signed)
tts in progress 

## 2019-03-09 NOTE — ED Notes (Signed)
Tele cart in room  

## 2019-03-10 LAB — URINALYSIS, ROUTINE W REFLEX MICROSCOPIC
Bilirubin Urine: NEGATIVE
Glucose, UA: NEGATIVE mg/dL
Ketones, ur: NEGATIVE mg/dL
Nitrite: POSITIVE — AB
Protein, ur: NEGATIVE mg/dL
Specific Gravity, Urine: 1.006 (ref 1.005–1.030)
pH: 5 (ref 5.0–8.0)

## 2019-03-10 LAB — RAPID URINE DRUG SCREEN, HOSP PERFORMED
Amphetamines: NOT DETECTED
Barbiturates: NOT DETECTED
Benzodiazepines: NOT DETECTED
Cocaine: NOT DETECTED
Opiates: NOT DETECTED
Tetrahydrocannabinol: NOT DETECTED

## 2019-03-10 MED ORDER — DIPHENHYDRAMINE HCL 50 MG/ML IJ SOLN
50.0000 mg | Freq: Once | INTRAMUSCULAR | Status: AC
  Administered 2019-03-10: 50 mg via INTRAMUSCULAR
  Filled 2019-03-10: qty 1

## 2019-03-10 MED ORDER — LIDOCAINE HCL (PF) 1 % IJ SOLN
INTRAMUSCULAR | Status: AC
  Administered 2019-03-10: 03:00:00
  Filled 2019-03-10: qty 2

## 2019-03-10 MED ORDER — CEFTRIAXONE SODIUM 1 G IJ SOLR
1.0000 g | Freq: Once | INTRAMUSCULAR | Status: AC
  Administered 2019-03-10: 03:00:00 1 g via INTRAMUSCULAR
  Filled 2019-03-10: qty 10

## 2019-03-10 MED ORDER — CEPHALEXIN 500 MG PO CAPS
1000.0000 mg | ORAL_CAPSULE | Freq: Two times a day (BID) | ORAL | Status: DC
  Administered 2019-03-10: 1000 mg via ORAL
  Filled 2019-03-10: qty 2

## 2019-03-10 MED ORDER — CEPHALEXIN 500 MG PO CAPS: ORAL_CAPSULE | ORAL | 0 refills | Status: DC

## 2019-03-10 MED ORDER — LORAZEPAM 2 MG/ML IJ SOLN
2.0000 mg | Freq: Once | INTRAMUSCULAR | Status: AC
  Administered 2019-03-10: 2 mg via INTRAMUSCULAR
  Filled 2019-03-10: qty 1

## 2019-03-10 MED ORDER — HALOPERIDOL LACTATE 5 MG/ML IJ SOLN
10.0000 mg | Freq: Once | INTRAMUSCULAR | Status: AC
  Administered 2019-03-10: 10 mg via INTRAMUSCULAR
  Filled 2019-03-10: qty 2

## 2019-03-10 MED ORDER — ZIPRASIDONE MESYLATE 20 MG IM SOLR
15.0000 mg | Freq: Once | INTRAMUSCULAR | Status: AC
  Administered 2019-03-10: 15 mg via INTRAMUSCULAR

## 2019-03-10 MED ORDER — ZIPRASIDONE MESYLATE 20 MG IM SOLR
5.0000 mg | Freq: Once | INTRAMUSCULAR | Status: AC
  Administered 2019-03-10: 5 mg via INTRAMUSCULAR
  Filled 2019-03-10: qty 20

## 2019-03-10 NOTE — BHH Counselor (Signed)
TTS reassessment: Patient is laying in bed and appears drowsy. She is not oriented. She states "I was taken here by mistake. I was transferred here." Patient states she does not know where she came from or who brought her. Patient denies SI. When asked about HI she states "I want to really bad." When asked who she states "Izell [place]." Patient reportedly states that she is cold during assessment. Patient's RN provided her with more blankets. Patient states "I do not want to answer any more questions." Patient continues to meet in patient criteria.

## 2019-03-10 NOTE — ED Notes (Signed)
IVC paper work faxed to [employer]

## 2019-03-10 NOTE — ED Notes (Signed)
Pt was given meal tray and said she was not hungry.

## 2019-03-10 NOTE — ED Notes (Signed)
Pt given dinner tray and purewick placed.

## 2019-03-10 NOTE — ED Notes (Signed)
Pt pulling mask off and breathing all over staff. Pt told not to do this because of the corona virus. Pt stated that she did not care. Pt asked again to pull mask up . Pt complied. Then took it off again.. same events repeated again and pt kept mask on

## 2019-03-10 NOTE — ED Notes (Signed)
Pt remains asleep and is no longer pulling off equipment. Restraints removed. Will continue to monitor.

## 2019-03-10 NOTE — ED Notes (Signed)
Pt placed in non-violent restraints and sitter at bedside. Will continue to monitor per [facility] policy and procedures.

## 2019-03-10 NOTE — Progress Notes (Signed)
Abigail Montgomery a 68 year-old female who came in yesterday with worsening anxiety. Patient required some sedation and restraints but is currently stable and off of restraints. During evaluation patient is alert/oriented x 4; calm/cooperative; and mood is congruent with affect. She does not appear to be responding to internal/external stimuli or delusional thoughts. Patient denies suicidal/self-harm/homicidal ideation, AV hallucination, psychosis, and paranoia. Patient states she feels better and her anxiety is 0 on a scale of 1-5. Patient answered question appropriately.     I recommend that patient is psych cleared.

## 2019-03-10 NOTE — Progress Notes (Signed)
Additional nursing notes faxed to [facility] at their request.   Rhae Hammock Disposition CSW [facility] B[facility]/TTS [phone]

## 2019-03-10 NOTE — ED Provider Notes (Signed)
Patient presented here with complaints of altered mental status and agitation.  She was found to have a urinary tract infection and has been treated with antibiotics.  Patient initially uncooperative and combative, requiring IVC.  Patient is now awake, alert, cooperative, and appears appropriate.  She was reevaluated by TTS who feels as though she is psychiatrically cleared.  Patient also appears medically cleared and I believe stable for discharge.  She tells me that these behaviors occur sometimes when she gets urinary tract infections.  She will be discharged with Keflex and understands to return as needed for any problems.   Veryl Speak, MD 03/10/19 2148

## 2019-03-10 NOTE — ED Provider Notes (Signed)
Medical screening examination/treatment/procedure(s) were conducted as a shared visit with non-physician practitioner(s) and myself.  I personally evaluated the patient during the encounter.  Here with anxiety worsening over the last month and significantly worse last week. Has h/o same and some type of past haldol use. TTS consulted and will place geri-psych. Required multiple medications to sedate, but ultimately was able to. Afterwards, ECG showed prolonged QTc. Will hold on anymore QT prolonging sedation agents if possible. Also found to have cystitis. No e/o sepsis or severe infection. May be exacerbating symptoms but doesn't seem to be the primary factor. IM rocephin now and BID keflex x 5 days.   CRITICAL CARE Performed by: Merrily Pew Total critical care time: 35 minutes Critical care time was exclusive of separately billable procedures and treating other patients. Critical care was necessary to treat or prevent imminent or life-threatening deterioration. Critical care was time spent personally by me on the following activities: development of treatment plan with patient and/or surrogate as well as nursing, discussions with consultants, evaluation of patient's response to treatment, examination of patient, obtaining history from patient or surrogate, ordering and performing treatments and interventions, ordering and review of laboratory studies, ordering and review of radiographic studies, pulse oximetry and re-evaluation of patient's condition.   EKG Interpretation  Date/Time:  Thursday March 10 2019 02:05:11 EST Ventricular Rate:  89 PR Interval:    QRS Duration: 84 QT Interval:  460 QTC Calculation: 560 R Axis:   47 Text Interpretation: Sinus rhythm Low voltage, precordial leads Abnormal R-wave progression, early transition Prolonged QT interval QT prolonged compared to previous Confirmed by Merrily Pew [phone]) on 03/10/2019 3:52:36 AM      Keni Elison, Corene Cornea, MD 03/10/19  [phone]

## 2019-03-10 NOTE — ED Notes (Addendum)
Pt yelling at staff and at husband. Pt trying to leave after using restroom. Pt given IM orders . EDP to fill out IVC

## 2019-03-10 NOTE — ED Notes (Addendum)
Patient's husband visited. Informed husband, Zaley Talley, that I would call him with update at [phone].

## 2019-03-10 NOTE — Discharge Instructions (Addendum)
Begin taking Keflex as prescribed.  Follow-up with your primary doctor if not improving in the next week, and return to the ER if symptoms significantly worsen or change.

## 2019-03-10 NOTE — ED Notes (Signed)
IVC paper work faxed to [employer] at this time.

## 2019-03-10 NOTE — Progress Notes (Signed)
Pt meets inpatient criteria. Referral information has been sent to the following hospitals for review:  [facility]  [facility]  [place]      Disposition will continue to follow for inpatient placement needs.   Audree Camel, LCSW, [place] Disposition [facility] BHH/TTS [phone]

## 2019-03-10 NOTE — Care Management (Signed)
NP will reassess the patient.

## 2019-03-10 NOTE — BH Assessment (Signed)
Tele Assessment Note   Patient Name: Abigail Montgomery MRN: [id number] Referring Physician: Pauline Aus, PA Location of Patient: [facility] Location of Provider: Behavioral Health TTS Department  Abigail Montgomery is an 68 y.o. female presenting to the ED accompanied by husband with complaints of anxiety and medication management. Abigail Montgomery, patient husband was present and presented information with patients permission during assessment. Patient reported to EDP that she was having difficulty with anxiety and stress related to family issues. Patient's spouse reported that patient has experienced anxiety for the past 7 years, but has not been on medications recently.  Patient used Haldol in the 80s for anxiety.  Patient's spouse said that she has "not been acting right" for several weeks but worse this week. Patient was seen at local urgent care and given prescription for Vistaril and patient's spouse states the medication has not been helping her current symptoms. Patient spouse reported patient has been argumentative and combative at times.  Patient denies SI, HI and psychosis. During assessment patient sleep and did not answer questions fully due to medication.   Prior to assessment and after assessment per nursing notes, patient yelling at husband and staff, attempting to smack RNs hand away, throwing water on husband and in room, taking mask off breathing all over staff after being told not to do that. Patient attempted to leave after using the bathroom. EDP completed IVC papers on patient.  Diagnosis: Anxiety Disorder  Past Medical History:  Past Medical History:  Diagnosis Date  . Abdominal pain   . Anxiety   . Arthritis   . Back pain   . Chronic knee pain   . Complication of anesthesia   . DDD (degenerative disc disease), cervical   . Depression   . Heart murmur   . Hypertension   . Knee pain   . Left ankle pain   . Left arm pain   . Low back pain   . Peripheral neuropathy   . PONV  (postoperative nausea and vomiting)   . Radiculopathy     Past Surgical History:  Procedure Laterality Date  . ABDOMINAL HYSTERECTOMY    . COLONOSCOPY WITH PROPOFOL N/A 08/24/2017   Diverticulosis in the entire colon. Otherwise normal. Repeat in 10 years.   . cyst removed from lower abdomen    . INCISIONAL HERNIA REPAIR N/A 08/18/2014   Procedure: Sherald Hess HERNIORRHAPHY WITH MESH;  Surgeon: Franky Macho Md, MD;  Location: [facility];  Service: General;  Laterality: N/A;  . INSERTION OF MESH N/A 08/18/2014   Procedure: INSERTION OF MESH;  Surgeon: Franky Macho Md, MD;  Location: [facility];  Service: General;  Laterality: N/A;  . TONSILECTOMY, ADENOIDECTOMY, BILATERAL MYRINGOTOMY AND TUBES    . UTERINE FIBROID SURGERY      Family History:  Family History  Problem Relation Age of Onset  . Heart disease Mother   . Parkinson's disease Father   . Colon polyps Father        >60  . Heart disease Other   . Arthritis Other   . Diabetes Other   . Kidney disease Other   . Colon cancer Neg Hx     Social History:  reports that she has never smoked. She has never used smokeless tobacco. She reports current alcohol use. She reports that she does not use drugs.  Additional Social History:  Alcohol / Drug Use Pain Medications: see MAR Prescriptions: see MAR Over the Counter: see MAR  CIWA: CIWA-Ar BP: (!) 159/52 Pulse Rate:  78 COWS:    Allergies:  Allergies  Allergen Reactions  . Latex Rash    Home Medications: (Not in a hospital admission)   OB/GYN Status:  No LMP recorded. Patient has had a hysterectomy.  General Assessment Data Location of Assessment: [facility] TTS Assessment: In system Is this a Tele or Face-to-Face Assessment?: Tele Assessment Is this an Initial Assessment or a Re-assessment for this encounter?: Initial Assessment Patient Accompanied by:: N/A Language Other than English: No Living Arrangements: (family home) What gender do you identify as?: Female Marital  status: Married Living Arrangements: Spouse/significant other Can pt return to current living arrangement?: Yes Admission Status: Voluntary Is patient capable of signing voluntary admission?: Yes Referral Source: Self/Family/Friend     Crisis Care Plan Living Arrangements: Spouse/significant other Legal Guardian: (self) Name of Psychiatrist: (none) Name of Therapist: (none)  Education Status Is patient currently in school?: No Is the patient employed, unemployed or receiving disability?: (retired)  Risk to self with the past 6 months Suicidal Ideation: No Has patient been a risk to self within the past 6 months prior to admission? : No Suicidal Intent: No Has patient had any suicidal intent within the past 6 months prior to admission? : No Is patient at risk for suicide?: No Suicidal Plan?: No Has patient had any suicidal plan within the past 6 months prior to admission? : No Access to Means: No What has been your use of drugs/alcohol within the last 12 months?: (none) Previous Attempts/Gestures: No How many times?: (0) Other Self Harm Risks: (n/a) Triggers for Past Attempts: (n/a) Intentional Self Injurious Behavior: None Family Suicide History: No Recent stressful life event(s): (family discord) Persecutory voices/beliefs?: No Depression: (unable to assess) Depression Symptoms: (unable to assess) Substance abuse history and/or treatment for substance abuse?: No Suicide prevention information given to non-admitted patients: Not applicable  Risk to Others within the past 6 months Homicidal Ideation: No Does patient have any lifetime risk of violence toward others beyond the six months prior to admission? : No Thoughts of Harm to Others: No Current Homicidal Intent: No Current Homicidal Plan: No Access to Homicidal Means: No Identified Victim: (n/a) History of harm to others?: No Assessment of Violence: None Noted Violent Behavior Description: (none reported) Does  patient have access to weapons?: No Criminal Charges Pending?: No Does patient have a court date: No Is patient on probation?: No  Psychosis Hallucinations: (unable to assess) Delusions: None noted  Mental Status Report Appearance/Hygiene: Unable to Assess Eye Contact: Unable to Assess Speech: Unable to assess Level of Consciousness: Sleeping, Drowsy Mood: Sad Affect: Unable to Assess Anxiety Level: Minimal Thought Processes: Unable to Assess Judgement: Unable to Assess Orientation: Unable to assess Obsessive Compulsive Thoughts/Behaviors: Unable to Assess  Cognitive Functioning Concentration: Unable to Assess Memory: Unable to Assess Is patient IDD: No Insight: Unable to Assess Impulse Control: Unable to Assess Appetite: Fair Have you had any weight changes? : No Change Sleep: No Change Total Hours of Sleep: (8) Vegetative Symptoms: None  ADLScreening [facility] Assessment Services) Patient's cognitive ability adequate to safely complete daily activities?: Yes Patient able to express need for assistance with ADLs?: Yes Independently performs ADLs?: Yes (appropriate for developmental age)  Prior Inpatient Therapy Prior Inpatient Therapy: No  Prior Outpatient Therapy Prior Outpatient Therapy: No Does patient have an ACCT team?: No Does patient have Intensive In-House Services?  : No Does patient have [facility] services? : No Does patient have P4CC services?: No  ADL Screening (condition at time of admission)  Patient's cognitive ability adequate to safely complete daily activities?: Yes Patient able to express need for assistance with ADLs?: Yes Independently performs ADLs?: Yes (appropriate for developmental age)  Advance Directives (For Healthcare) Does Patient Have a Medical Advance Directive?: No Would patient like information on creating a medical advance directive?: No - Patient declined   Disposition:  Disposition Initial Assessment Completed for this  Encounter: Yes  Renaye RakersAdaku Anike, NP, patient meets inpatient treatment. Geropsych inpatient recommended. TTS to secure placement.  This service was provided via telemedicine using a 2-way, interactive audio and video technology.  Names of all persons participating in this telemedicine service and their role in this encounter. Name: Ainsley SpinnerGeraldine Carfagno Role: Patient  Name: Al CorpusLatisha Markala Sitts Role: TTS Clinician  Name:  Role:   Name:  Role:     Burnetta SabinLatisha D Suliman Termini 03/10/2019 2:05 AM

## 2019-03-10 NOTE — ED Notes (Signed)
Talbot Grumbling, NP, patient meets inpatient mental health treatment. Recommended for geropsych. TTS to secure placement.

## 2019-03-12 LAB — URINE CULTURE: Culture: >=100000 — AB

## 2019-03-13 ENCOUNTER — Telehealth: Payer: Self-pay | Admitting: Emergency Medicine

## 2019-03-13 NOTE — Telephone Encounter (Signed)
Post ED Visit - Positive Culture Follow-up  Culture report reviewed by antimicrobial stewardship pharmacist: [place] Team []  Elenor Quinones, Pharm.D. []  Heide Guile, Pharm.D., BCPS AQ-ID []  Parks Neptune, Pharm.D., BCPS []  Alycia Rossetti, Pharm.D., BCPS []  Ligonier, Pharm.D., BCPS, AAHIVP []  Legrand Como, Pharm.D., BCPS, AAHIVP []  Salome Arnt, PharmD, BCPS []  Johnnette Gourd, PharmD, BCPS [x]  Hughes Better, PharmD, BCPS []  Leeroy Cha, PharmD []  Laqueta Linden, PharmD, BCPS []  Albertina Parr, PharmD  [facility] Team []  Leodis Sias, PharmD []  Lindell Spar, PharmD []  Royetta Asal, PharmD []  Graylin Shiver, Rph []  Rema Fendt) Glennon Mac, PharmD []  Arlyn Dunning, PharmD []  Netta Cedars, PharmD []  Dia Sitter, PharmD []  Leone Haven, PharmD []  Gretta Arab, PharmD []  Theodis Shove, PharmD []  Peggyann Juba, PharmD []  Reuel Boom, PharmD   Positive urine culture Treated with Cephalexin, organism sensitive to the same and no further patient follow-up is required at this time.  Sandi Raveling Velina Drollinger 03/13/2019, 2:50 PM

## 2019-03-14 ENCOUNTER — Other Ambulatory Visit (HOSPITAL_COMMUNITY): Payer: Self-pay | Admitting: Adult Health Nurse Practitioner

## 2019-03-14 DIAGNOSIS — N644 Mastodynia: Secondary | ICD-10-CM

## 2019-03-15 ENCOUNTER — Other Ambulatory Visit: Payer: Self-pay

## 2019-03-15 ENCOUNTER — Ambulatory Visit (HOSPITAL_COMMUNITY)
Admission: RE | Admit: 2019-03-15 | Discharge: 2019-03-15 | Disposition: A | Discharge status: Home / Self Care | Payer: Medicare Other | Source: Ambulatory Visit | Attending: Adult Health Nurse Practitioner | Admitting: Adult Health Nurse Practitioner

## 2019-03-15 ENCOUNTER — Ambulatory Visit (HOSPITAL_COMMUNITY): Payer: Medicare Other

## 2019-03-15 DIAGNOSIS — N644 Mastodynia: Secondary | ICD-10-CM

## 2019-03-15 DIAGNOSIS — R928 Other abnormal and inconclusive findings on diagnostic imaging of breast: Secondary | ICD-10-CM | POA: Diagnosis not present

## 2019-03-15 DIAGNOSIS — N6489 Other specified disorders of breast: Secondary | ICD-10-CM | POA: Diagnosis not present

## 2019-03-22 DIAGNOSIS — I10 Essential (primary) hypertension: Secondary | ICD-10-CM | POA: Diagnosis not present

## 2019-03-22 DIAGNOSIS — I129 Hypertensive chronic kidney disease with stage 1 through stage 4 chronic kidney disease, or unspecified chronic kidney disease: Secondary | ICD-10-CM | POA: Diagnosis not present

## 2019-04-12 DIAGNOSIS — I129 Hypertensive chronic kidney disease with stage 1 through stage 4 chronic kidney disease, or unspecified chronic kidney disease: Secondary | ICD-10-CM | POA: Diagnosis not present

## 2019-04-12 DIAGNOSIS — K219 Gastro-esophageal reflux disease without esophagitis: Secondary | ICD-10-CM | POA: Diagnosis not present

## 2019-04-12 DIAGNOSIS — N183 Chronic kidney disease, stage 3 unspecified: Secondary | ICD-10-CM | POA: Diagnosis not present

## 2019-05-18 DIAGNOSIS — M1990 Unspecified osteoarthritis, unspecified site: Secondary | ICD-10-CM | POA: Diagnosis not present

## 2019-05-18 DIAGNOSIS — K219 Gastro-esophageal reflux disease without esophagitis: Secondary | ICD-10-CM | POA: Diagnosis not present

## 2019-05-18 DIAGNOSIS — N183 Chronic kidney disease, stage 3 unspecified: Secondary | ICD-10-CM | POA: Diagnosis not present

## 2019-05-18 DIAGNOSIS — I129 Hypertensive chronic kidney disease with stage 1 through stage 4 chronic kidney disease, or unspecified chronic kidney disease: Secondary | ICD-10-CM | POA: Diagnosis not present

## 2019-06-06 DIAGNOSIS — I129 Hypertensive chronic kidney disease with stage 1 through stage 4 chronic kidney disease, or unspecified chronic kidney disease: Secondary | ICD-10-CM | POA: Diagnosis not present

## 2019-06-06 DIAGNOSIS — N189 Chronic kidney disease, unspecified: Secondary | ICD-10-CM | POA: Diagnosis not present

## 2019-07-06 ENCOUNTER — Ambulatory Visit: Payer: Medicare Other | Admitting: Gastroenterology

## 2019-07-07 DIAGNOSIS — K219 Gastro-esophageal reflux disease without esophagitis: Secondary | ICD-10-CM | POA: Diagnosis not present

## 2019-07-07 DIAGNOSIS — I10 Essential (primary) hypertension: Secondary | ICD-10-CM | POA: Diagnosis not present

## 2019-07-07 DIAGNOSIS — I129 Hypertensive chronic kidney disease with stage 1 through stage 4 chronic kidney disease, or unspecified chronic kidney disease: Secondary | ICD-10-CM | POA: Diagnosis not present

## 2019-07-07 DIAGNOSIS — N183 Chronic kidney disease, stage 3 unspecified: Secondary | ICD-10-CM | POA: Diagnosis not present

## 2019-08-01 DIAGNOSIS — G8929 Other chronic pain: Secondary | ICD-10-CM | POA: Diagnosis not present

## 2019-08-01 DIAGNOSIS — G894 Chronic pain syndrome: Secondary | ICD-10-CM | POA: Diagnosis not present

## 2019-08-01 DIAGNOSIS — I10 Essential (primary) hypertension: Secondary | ICD-10-CM | POA: Diagnosis not present

## 2019-08-04 DIAGNOSIS — I129 Hypertensive chronic kidney disease with stage 1 through stage 4 chronic kidney disease, or unspecified chronic kidney disease: Secondary | ICD-10-CM | POA: Diagnosis not present

## 2019-08-04 DIAGNOSIS — M545 Low back pain: Secondary | ICD-10-CM | POA: Diagnosis not present

## 2019-08-04 DIAGNOSIS — K219 Gastro-esophageal reflux disease without esophagitis: Secondary | ICD-10-CM | POA: Diagnosis not present

## 2019-08-04 DIAGNOSIS — Z0001 Encounter for general adult medical examination with abnormal findings: Secondary | ICD-10-CM | POA: Diagnosis not present

## 2019-08-04 DIAGNOSIS — R600 Localized edema: Secondary | ICD-10-CM | POA: Diagnosis not present

## 2019-08-04 DIAGNOSIS — N1831 Chronic kidney disease, stage 3a: Secondary | ICD-10-CM | POA: Diagnosis not present

## 2019-08-10 DIAGNOSIS — I129 Hypertensive chronic kidney disease with stage 1 through stage 4 chronic kidney disease, or unspecified chronic kidney disease: Secondary | ICD-10-CM | POA: Diagnosis not present

## 2019-08-10 DIAGNOSIS — K219 Gastro-esophageal reflux disease without esophagitis: Secondary | ICD-10-CM | POA: Diagnosis not present

## 2019-08-10 DIAGNOSIS — N189 Chronic kidney disease, unspecified: Secondary | ICD-10-CM | POA: Diagnosis not present

## 2019-08-10 DIAGNOSIS — Z0001 Encounter for general adult medical examination with abnormal findings: Secondary | ICD-10-CM | POA: Diagnosis not present

## 2019-09-12 DIAGNOSIS — K219 Gastro-esophageal reflux disease without esophagitis: Secondary | ICD-10-CM | POA: Diagnosis not present

## 2019-09-12 DIAGNOSIS — Z0001 Encounter for general adult medical examination with abnormal findings: Secondary | ICD-10-CM | POA: Diagnosis not present

## 2019-09-12 DIAGNOSIS — I129 Hypertensive chronic kidney disease with stage 1 through stage 4 chronic kidney disease, or unspecified chronic kidney disease: Secondary | ICD-10-CM | POA: Diagnosis not present

## 2019-09-12 DIAGNOSIS — N189 Chronic kidney disease, unspecified: Secondary | ICD-10-CM | POA: Diagnosis not present

## 2019-10-11 DIAGNOSIS — R600 Localized edema: Secondary | ICD-10-CM | POA: Diagnosis not present

## 2019-10-11 DIAGNOSIS — I10 Essential (primary) hypertension: Secondary | ICD-10-CM | POA: Diagnosis not present

## 2019-10-11 DIAGNOSIS — K219 Gastro-esophageal reflux disease without esophagitis: Secondary | ICD-10-CM | POA: Diagnosis not present

## 2019-10-19 DIAGNOSIS — N644 Mastodynia: Secondary | ICD-10-CM | POA: Diagnosis not present

## 2019-10-24 ENCOUNTER — Other Ambulatory Visit (HOSPITAL_COMMUNITY): Payer: Self-pay | Admitting: Internal Medicine

## 2019-10-24 DIAGNOSIS — N644 Mastodynia: Secondary | ICD-10-CM

## 2019-11-15 ENCOUNTER — Other Ambulatory Visit: Payer: Self-pay

## 2019-11-15 ENCOUNTER — Ambulatory Visit (HOSPITAL_COMMUNITY)
Admission: RE | Admit: 2019-11-15 | Discharge: 2019-11-15 | Disposition: A | Discharge status: Home / Self Care | Payer: Medicare Other | Source: Ambulatory Visit | Attending: Internal Medicine | Admitting: Internal Medicine

## 2019-11-15 DIAGNOSIS — N644 Mastodynia: Secondary | ICD-10-CM

## 2019-11-15 DIAGNOSIS — R921 Mammographic calcification found on diagnostic imaging of breast: Secondary | ICD-10-CM | POA: Diagnosis not present

## 2019-11-18 DIAGNOSIS — I10 Essential (primary) hypertension: Secondary | ICD-10-CM | POA: Diagnosis not present

## 2019-11-18 DIAGNOSIS — K219 Gastro-esophageal reflux disease without esophagitis: Secondary | ICD-10-CM | POA: Diagnosis not present

## 2019-11-18 DIAGNOSIS — R600 Localized edema: Secondary | ICD-10-CM | POA: Diagnosis not present

## 2019-12-05 DIAGNOSIS — I10 Essential (primary) hypertension: Secondary | ICD-10-CM | POA: Diagnosis not present

## 2019-12-05 DIAGNOSIS — R600 Localized edema: Secondary | ICD-10-CM | POA: Diagnosis not present

## 2019-12-05 DIAGNOSIS — K219 Gastro-esophageal reflux disease without esophagitis: Secondary | ICD-10-CM | POA: Diagnosis not present

## 2020-01-10 DIAGNOSIS — R600 Localized edema: Secondary | ICD-10-CM | POA: Diagnosis not present

## 2020-01-10 DIAGNOSIS — I10 Essential (primary) hypertension: Secondary | ICD-10-CM | POA: Diagnosis not present

## 2020-01-10 DIAGNOSIS — K219 Gastro-esophageal reflux disease without esophagitis: Secondary | ICD-10-CM | POA: Diagnosis not present

## 2020-02-10 DIAGNOSIS — K219 Gastro-esophageal reflux disease without esophagitis: Secondary | ICD-10-CM | POA: Diagnosis not present

## 2020-02-10 DIAGNOSIS — I10 Essential (primary) hypertension: Secondary | ICD-10-CM | POA: Diagnosis not present

## 2020-02-10 DIAGNOSIS — R600 Localized edema: Secondary | ICD-10-CM | POA: Diagnosis not present

## 2020-04-09 DIAGNOSIS — M1990 Unspecified osteoarthritis, unspecified site: Secondary | ICD-10-CM | POA: Diagnosis not present

## 2020-04-09 DIAGNOSIS — R7301 Impaired fasting glucose: Secondary | ICD-10-CM | POA: Diagnosis not present

## 2020-04-09 DIAGNOSIS — I10 Essential (primary) hypertension: Secondary | ICD-10-CM | POA: Diagnosis not present

## 2020-04-09 DIAGNOSIS — R0602 Shortness of breath: Secondary | ICD-10-CM | POA: Diagnosis not present

## 2020-04-09 DIAGNOSIS — Z9181 History of falling: Secondary | ICD-10-CM | POA: Diagnosis not present

## 2020-04-09 DIAGNOSIS — N189 Chronic kidney disease, unspecified: Secondary | ICD-10-CM | POA: Diagnosis not present

## 2020-04-10 DIAGNOSIS — K219 Gastro-esophageal reflux disease without esophagitis: Secondary | ICD-10-CM | POA: Diagnosis not present

## 2020-04-10 DIAGNOSIS — I129 Hypertensive chronic kidney disease with stage 1 through stage 4 chronic kidney disease, or unspecified chronic kidney disease: Secondary | ICD-10-CM | POA: Diagnosis not present

## 2020-04-10 DIAGNOSIS — R600 Localized edema: Secondary | ICD-10-CM | POA: Diagnosis not present

## 2020-04-10 DIAGNOSIS — N1831 Chronic kidney disease, stage 3a: Secondary | ICD-10-CM | POA: Diagnosis not present

## 2020-04-30 ENCOUNTER — Telehealth: Payer: Self-pay | Admitting: Orthopedic Surgery

## 2020-04-30 NOTE — Telephone Encounter (Signed)
Called back to patient; scheduled appointment; aware. 

## 2020-04-30 NOTE — Telephone Encounter (Signed)
Patient call to inquire about appointment for new injury to left "top of foot", due to fall on 04/24/20; said has had no treatment as of yet. I offered appointment with Dr Dallas Schimke, however, patient requests to see Dr Romeo Apple only. Due to no treatment, please advise, based on schedule for Emergency room-related/on call only.  Patient's ph# is [phone]

## 2020-04-30 NOTE — Telephone Encounter (Signed)
Use one of the on call spots this week, not tomorrow.

## 2020-05-03 ENCOUNTER — Ambulatory Visit: Payer: Medicare Other

## 2020-05-03 ENCOUNTER — Encounter: Payer: Self-pay | Admitting: Orthopedic Surgery

## 2020-05-03 ENCOUNTER — Other Ambulatory Visit: Payer: Self-pay

## 2020-05-03 ENCOUNTER — Ambulatory Visit: Payer: Medicare Other | Admitting: Orthopedic Surgery

## 2020-05-03 VITALS — BP 191/95 | HR 97 | Ht 65.0 in | Wt 295.0 lb

## 2020-05-03 DIAGNOSIS — M79672 Pain in left foot: Secondary | ICD-10-CM

## 2020-05-03 DIAGNOSIS — W06XXXA Fall from bed, initial encounter: Secondary | ICD-10-CM | POA: Diagnosis not present

## 2020-05-03 DIAGNOSIS — S93502A Unspecified sprain of left great toe, initial encounter: Secondary | ICD-10-CM | POA: Diagnosis not present

## 2020-05-03 NOTE — Progress Notes (Signed)
NEW PROBLEM//OFFICE VISIT  Summary assessment and plan: Probable sprain of the left great toe recommend hard soled shoe for 4 weeks and follow-up  Chief Complaint  Patient presents with  . Foot Pain    Left since 04/24/20    69 year old female fell out of bed injured her great toe on the left side complains of pain and swelling difficulty walking and soft soled shoes presents for evaluation and treatment   Review of Systems  Constitutional: Negative for fever.  Respiratory: Negative for shortness of breath.   Cardiovascular: Negative for chest pain.  Gastrointestinal: Negative.   Skin: Negative.      Past Medical History:  Diagnosis Date  . Abdominal pain   . Anxiety   . Arthritis   . Back pain   . Chronic knee pain   . Complication of anesthesia   . DDD (degenerative disc disease), cervical   . Depression   . Heart murmur   . Hypertension   . Knee pain   . Left ankle pain   . Left arm pain   . Low back pain   . Peripheral neuropathy   . PONV (postoperative nausea and vomiting)   . Radiculopathy     Past Surgical History:  Procedure Laterality Date  . ABDOMINAL HYSTERECTOMY    . COLONOSCOPY WITH PROPOFOL N/A 08/24/2017   Diverticulosis in the entire colon. Otherwise normal. Repeat in 10 years.   . cyst removed from lower abdomen    . INCISIONAL HERNIA REPAIR N/A 08/18/2014   Procedure: Sherald Hess HERNIORRHAPHY WITH MESH;  Surgeon: Franky Macho Md, MD;  Location: [facility];  Service: General;  Laterality: N/A;  . INSERTION OF MESH N/A 08/18/2014   Procedure: INSERTION OF MESH;  Surgeon: Franky Macho Md, MD;  Location: [facility];  Service: General;  Laterality: N/A;  . TONSILECTOMY, ADENOIDECTOMY, BILATERAL MYRINGOTOMY AND TUBES    . UTERINE FIBROID SURGERY      Family History  Problem Relation Age of Onset  . Heart disease Mother   . Parkinson's disease Father   . Colon polyps Father        >60  . Heart disease Other   . Arthritis Other   . Diabetes Other   .  Kidney disease Other   . Colon cancer Neg Hx    Social History   Tobacco Use  . Smoking status: Never Smoker  . Smokeless tobacco: Never Used  Substance Use Topics  . Alcohol use: Yes    Comment: occ  . Drug use: No    Allergies  Allergen Reactions  . Latex Rash    Current Meds  Medication Sig  . albuterol (PROVENTIL HFA;VENTOLIN HFA) 108 (90 BASE) MCG/ACT inhaler Inhale 1-2 puffs into the lungs every 6 (six) hours as needed for wheezing or shortness of breath.   . Ascorbic Acid (VITAMIN C) 1000 MG tablet Take 1,000 mg by mouth daily as needed (Immune support).   . cephALEXin (KEFLEX) 500 MG capsule 2 caps po bid x 5 days  . citalopram (CELEXA) 20 MG tablet Take 20 mg by mouth daily.  Marland Kitchen conjugated estrogens (PREMARIN) vaginal cream Place vaginally 3 (three) times a week. Use 0.5 grams. Per treatment.  . furosemide (LASIX) 20 MG tablet Take 20 mg by mouth daily as needed for fluid.   . hydrocortisone (ANUSOL-HC) 2.5 % rectal cream Place 1 application rectally 2 (two) times daily.  . hydrOXYzine (VISTARIL) 50 MG capsule Take 50 mg by mouth every 6 (six) hours as  needed for anxiety.  Marland Kitchen lisinopril-hydrochlorothiazide (PRINZIDE,ZESTORETIC) 20-12.5 MG per tablet Take 1 tablet by mouth daily.  . Magnesium 250 MG TABS Take 250 mg by mouth daily as needed (cramps).   . Menthol, Topical Analgesic, (ICY HOT EX) Apply 1 application topically daily as needed (pain).  . MYRBETRIQ 25 MG TB24 tablet Take 1 tablet by mouth daily.  . naproxen sodium (ALEVE) 220 MG tablet Take 440 mg by mouth daily as needed (pain).  . NYSTATIN powder Apply topically 3 (three) times daily as needed (for irritation).   Marland Kitchen oxybutynin (DITROPAN) 5 MG tablet Take 5 mg by mouth 2 (two) times daily.  Marland Kitchen oxyCODONE (OXY IR/ROXICODONE) 5 MG immediate release tablet Take 5 mg by mouth daily as needed for severe pain.  . Polyvinyl Alcohol-Povidone (REFRESH OP) Place 1 drop into both eyes 3 (three) times daily.  . Potassium 99  MG TABS Take 99 mg by mouth daily as needed (cramps).    BP (!) 191/95   Pulse 97   Ht 5\' 5"  (1.651 m)   Wt 295 lb (133.8 kg)   BMI 49.09 kg/m   Physical Exam Constitutional:      General: She is not in acute distress.    Appearance: She is well-developed.  Cardiovascular:     Comments: No peripheral edema Skin:    General: Skin is warm and dry.  Neurological:     Mental Status: She is alert and oriented to person, place, and time.     Sensory: No sensory deficit.     Coordination: Coordination normal.     Gait: Gait abnormal.     Deep Tendon Reflexes: Reflexes are normal and symmetric.  Psychiatric:        Mood and Affect: Mood normal.        Behavior: Behavior normal.        Thought Content: Thought content normal.        Judgment: Judgment normal.     Ortho Exam Left foot normal alignment of the great toe with tenderness around the MTP joint remaining portion of the toe is nontender the skin is intact without any ecchymosis or bruising sensation is normal color capillary refill excellent she has painful range of motion dorsiflexion and plantar flexion no motor deficit   MEDICAL DECISION MAKING  A.  Encounter Diagnoses  Name Primary?  . Pain in left foot Yes  . Sprain of left great toe, initial encounter     B. DATA ANALYSED:   IMAGING: Interpretation of images: Internal images of the left foot show no fracture dislocation or acute injury in the foot Orders: None  Outside records reviewed: None   C. MANAGEMENT   Hard soled shoe recheck clinically in 4 weeks no x-rays needed  No orders of the defined types were placed in this encounter.     , MD  05/03/2020 8:52 AM

## 2020-05-04 DIAGNOSIS — K219 Gastro-esophageal reflux disease without esophagitis: Secondary | ICD-10-CM | POA: Diagnosis not present

## 2020-05-04 DIAGNOSIS — I10 Essential (primary) hypertension: Secondary | ICD-10-CM | POA: Diagnosis not present

## 2020-05-04 DIAGNOSIS — R600 Localized edema: Secondary | ICD-10-CM | POA: Diagnosis not present

## 2020-05-08 ENCOUNTER — Telehealth: Payer: Self-pay | Admitting: Orthopedic Surgery

## 2020-05-08 NOTE — Telephone Encounter (Signed)
Patient called regarding a call she has received "from [facility]" possibly about her foot. I confirmed the follow up appointment scheduled 05/31/20. Please advise, as patient is confused - asked if we have transferred or referred her to another provider? Please advise.

## 2020-05-31 ENCOUNTER — Other Ambulatory Visit: Payer: Self-pay

## 2020-05-31 ENCOUNTER — Encounter: Payer: Self-pay | Admitting: Orthopedic Surgery

## 2020-05-31 ENCOUNTER — Ambulatory Visit: Payer: Medicare Other | Admitting: Orthopedic Surgery

## 2020-05-31 VITALS — BP 182/91 | HR 106 | Ht 65.0 in | Wt 295.0 lb

## 2020-05-31 DIAGNOSIS — S93502D Unspecified sprain of left great toe, subsequent encounter: Secondary | ICD-10-CM | POA: Diagnosis not present

## 2020-05-31 NOTE — Progress Notes (Signed)
Chief Complaint  Patient presents with  . Foot Pain    Left great toe    69 sprain left great toe treated with a hard soled shoe for 4 weeks  She is already started weaning herself out of the shoe with only occasional wear  Alignment is normal she has mild tenderness on the plantar aspect but pain-free plantar flexion dorsiflexion  Recommend continue weaning herself from the [place] shoe and see [place] if problems recur

## 2020-06-02 DIAGNOSIS — M8580 Other specified disorders of bone density and structure, unspecified site: Secondary | ICD-10-CM | POA: Diagnosis not present

## 2020-06-02 DIAGNOSIS — N1831 Chronic kidney disease, stage 3a: Secondary | ICD-10-CM | POA: Diagnosis not present

## 2020-06-02 DIAGNOSIS — G8929 Other chronic pain: Secondary | ICD-10-CM | POA: Diagnosis not present

## 2020-06-02 DIAGNOSIS — K219 Gastro-esophageal reflux disease without esophagitis: Secondary | ICD-10-CM | POA: Diagnosis not present

## 2020-06-02 DIAGNOSIS — R600 Localized edema: Secondary | ICD-10-CM | POA: Diagnosis not present

## 2020-06-02 DIAGNOSIS — I129 Hypertensive chronic kidney disease with stage 1 through stage 4 chronic kidney disease, or unspecified chronic kidney disease: Secondary | ICD-10-CM | POA: Diagnosis not present

## 2020-06-02 DIAGNOSIS — M545 Low back pain, unspecified: Secondary | ICD-10-CM | POA: Diagnosis not present

## 2020-07-02 DIAGNOSIS — K219 Gastro-esophageal reflux disease without esophagitis: Secondary | ICD-10-CM | POA: Diagnosis not present

## 2020-07-02 DIAGNOSIS — I129 Hypertensive chronic kidney disease with stage 1 through stage 4 chronic kidney disease, or unspecified chronic kidney disease: Secondary | ICD-10-CM | POA: Diagnosis not present

## 2020-08-01 DIAGNOSIS — M8580 Other specified disorders of bone density and structure, unspecified site: Secondary | ICD-10-CM | POA: Diagnosis not present

## 2020-08-01 DIAGNOSIS — G8929 Other chronic pain: Secondary | ICD-10-CM | POA: Diagnosis not present

## 2020-08-01 DIAGNOSIS — K219 Gastro-esophageal reflux disease without esophagitis: Secondary | ICD-10-CM | POA: Diagnosis not present

## 2020-09-02 DIAGNOSIS — I10 Essential (primary) hypertension: Secondary | ICD-10-CM | POA: Diagnosis not present

## 2020-09-02 DIAGNOSIS — M1990 Unspecified osteoarthritis, unspecified site: Secondary | ICD-10-CM | POA: Diagnosis not present

## 2020-10-02 DIAGNOSIS — I10 Essential (primary) hypertension: Secondary | ICD-10-CM | POA: Diagnosis not present

## 2020-10-02 DIAGNOSIS — M1990 Unspecified osteoarthritis, unspecified site: Secondary | ICD-10-CM | POA: Diagnosis not present

## 2020-10-25 IMAGING — MG DIGITAL DIAGNOSTIC BILAT W/ TOMO W/ CAD
6 of 10 series · 6 of 30 positions shown · non-contrast
Comparison: Previous exam(s).

CLINICAL DATA: 68-year-old female with history of RIGHT breast
abscess with recent drainage, finishing up antibiotic therapy.
with no drainage or pain. Patient states that also for annual
bilateral mammogram.

EXAM:
DIGITAL DIAGNOSTIC BILATERAL MAMMOGRAM WITH CAD AND TOMO
ULTRASOUND LEFT BREAST

[L MLO synth-2D (1 of 2)]
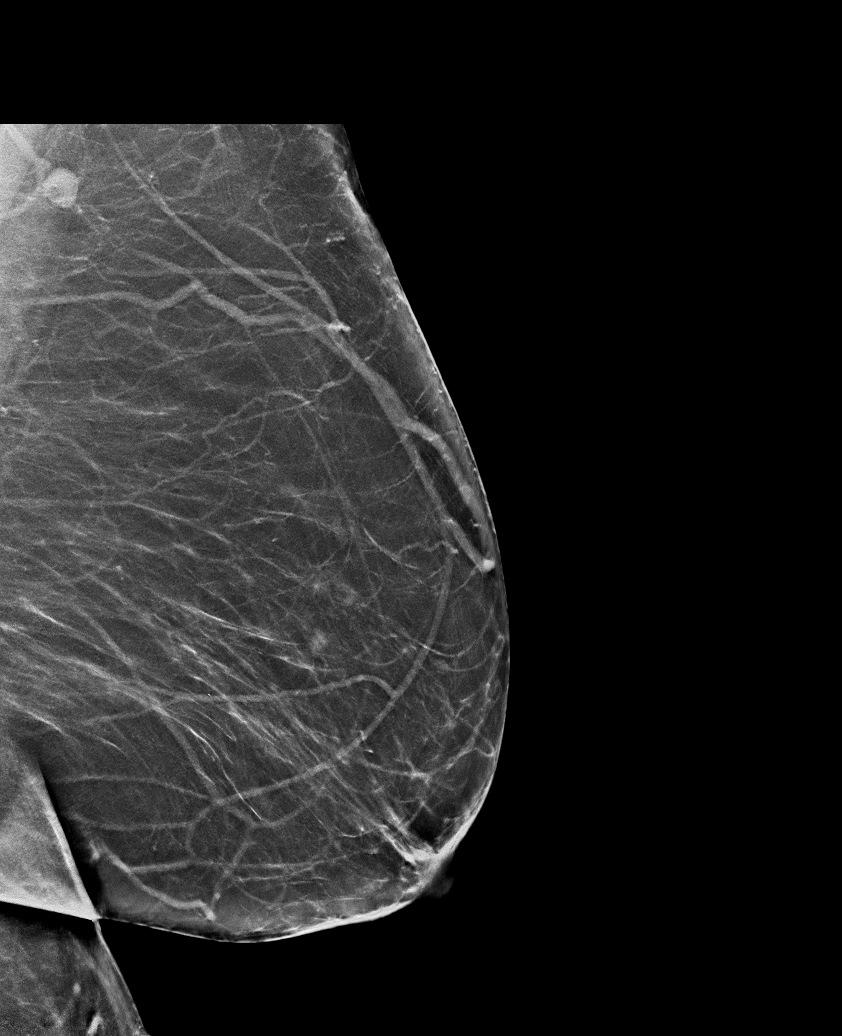

[L MLO synth-2D (2 of 2)]
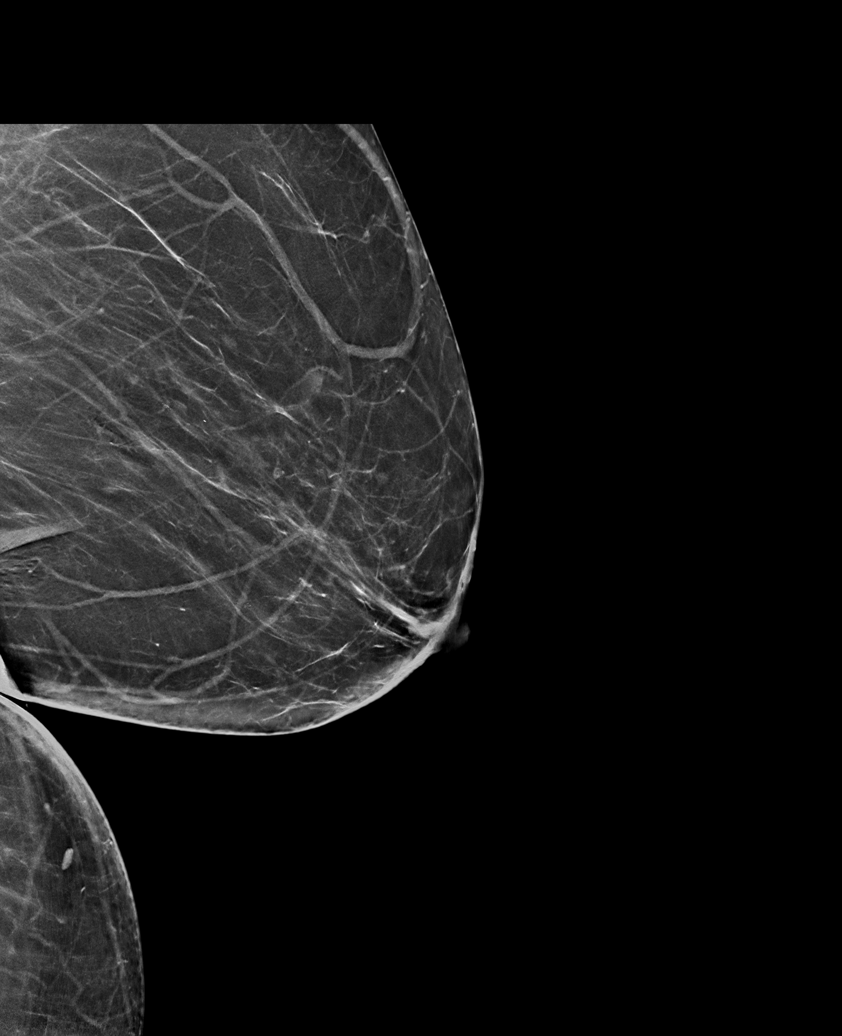

[R CC synth-2D]
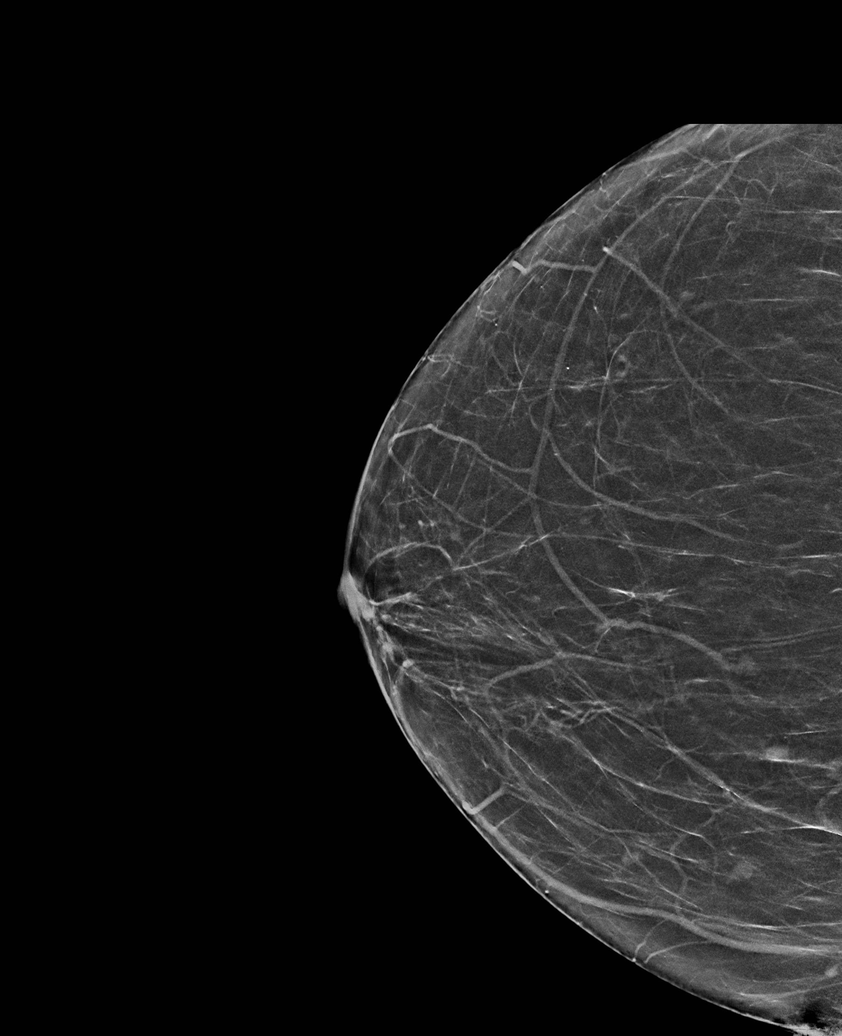

[R MLO synth-2D]
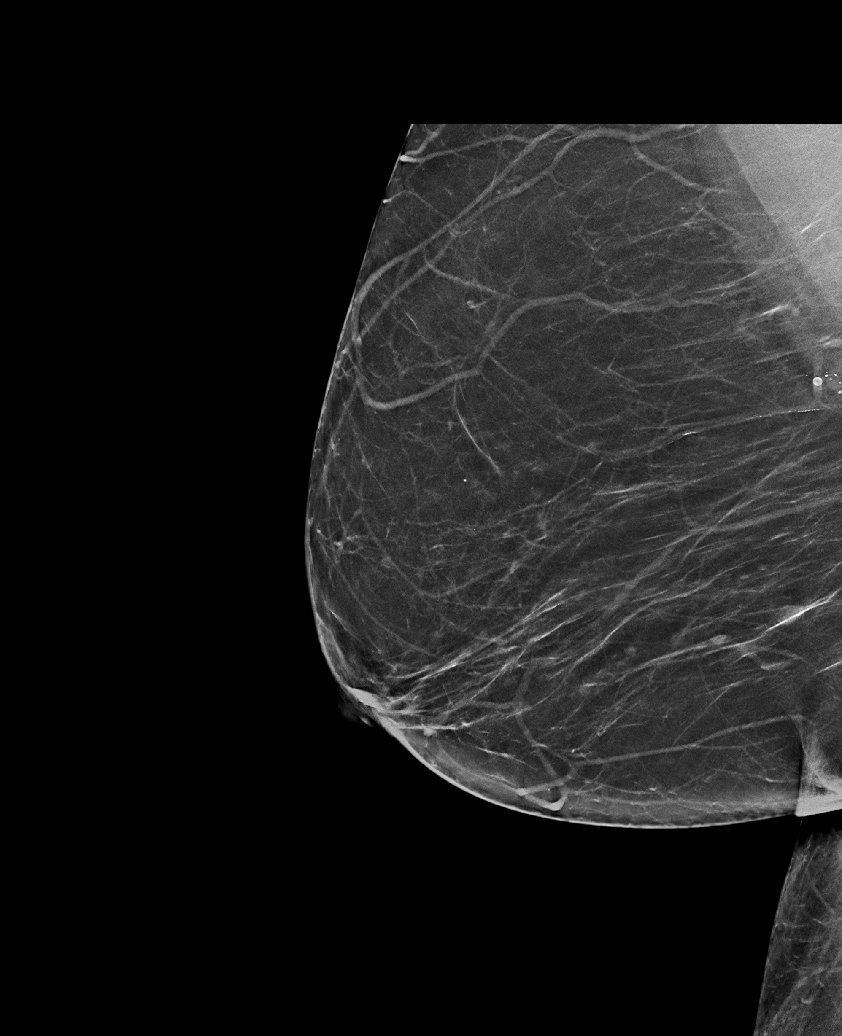

[L CC synth-2D]
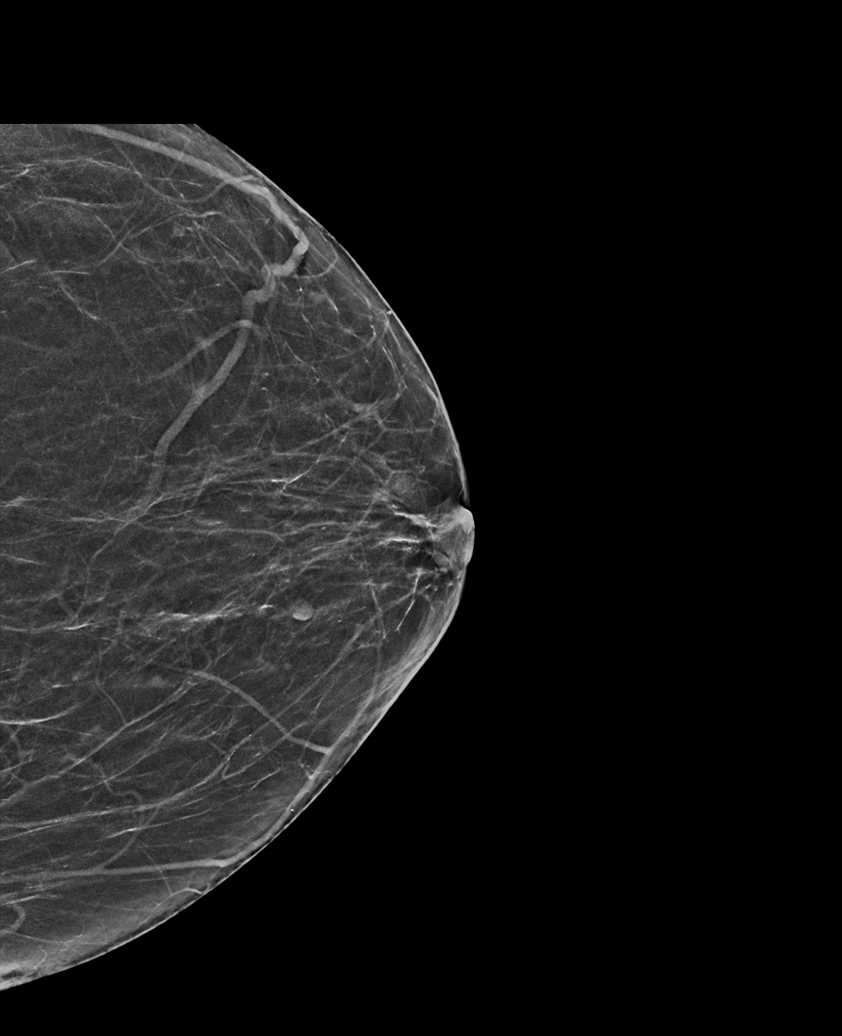

[L MLO tomo · tomo slice 35/69.0]
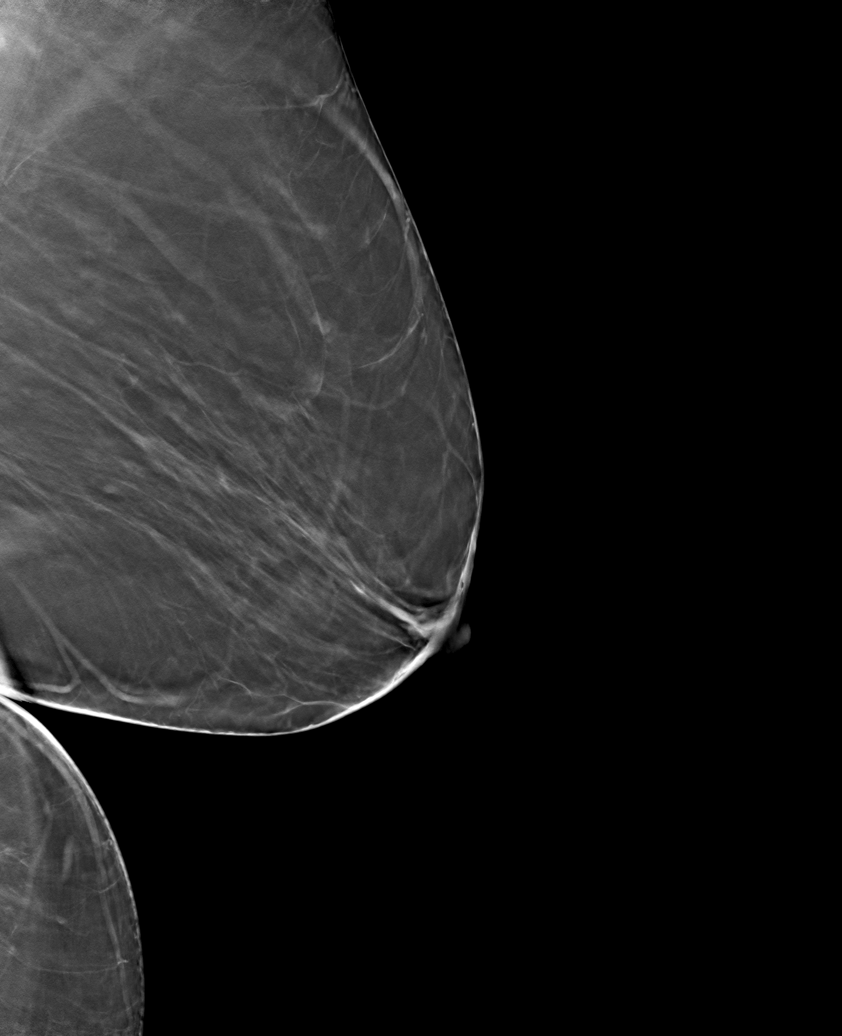

[6 of 30 positions shown; findings below may reference images not displayed]

ACR Breast Density Category b: There are scattered areas of
fibroglandular density.
FINDINGS: 2D/3D full field views of both breasts demonstrate an asymmetry
within the posterior UPPER RIGHT breast on the MLO view only at the
site of patient concern. This appears unchanged when compared to
prior studies where there was a superficial asymmetry, probably a
sebaceous cyst.

Mammographic images were processed with CAD.

On physical exam, mild skin discoloration with a central blackhead
noted in the far UPPER INNER RIGHT breast.

Targeted ultrasound is performed, showing a 0.4 x 1.5 x 1.2 cm area
of skin thickening without discrete abscess
IMPRESSION: 1. Small area of skin thickening at the site of recent
infection/inflammation. No evidence of abscess or breast malignancy.
2. No mammographic evidence of breast malignancy

RECOMMENDATION:
Bilateral screening mammogram in 1 year.

Clinical follow-up recommended for the area of concern in the RIGHT
breast. Any further workup should be based on clinical grounds.

I have discussed the findings and recommendations with the patient.
If applicable, a reminder letter will be sent to the patient
regarding the next appointment.

BI-RADS CATEGORY  2: Benign.

## 2020-11-01 DIAGNOSIS — I10 Essential (primary) hypertension: Secondary | ICD-10-CM | POA: Diagnosis not present

## 2020-11-01 DIAGNOSIS — M1990 Unspecified osteoarthritis, unspecified site: Secondary | ICD-10-CM | POA: Diagnosis not present

## 2020-12-02 DIAGNOSIS — M1990 Unspecified osteoarthritis, unspecified site: Secondary | ICD-10-CM | POA: Diagnosis not present

## 2020-12-02 DIAGNOSIS — I10 Essential (primary) hypertension: Secondary | ICD-10-CM | POA: Diagnosis not present

## 2020-12-05 DIAGNOSIS — L8990 Pressure ulcer of unspecified site, unspecified stage: Secondary | ICD-10-CM | POA: Diagnosis not present

## 2020-12-10 DIAGNOSIS — L8990 Pressure ulcer of unspecified site, unspecified stage: Secondary | ICD-10-CM | POA: Diagnosis not present

## 2021-01-02 DIAGNOSIS — M1990 Unspecified osteoarthritis, unspecified site: Secondary | ICD-10-CM | POA: Diagnosis not present

## 2021-01-02 DIAGNOSIS — I10 Essential (primary) hypertension: Secondary | ICD-10-CM | POA: Diagnosis not present

## 2021-02-01 DIAGNOSIS — M1990 Unspecified osteoarthritis, unspecified site: Secondary | ICD-10-CM | POA: Diagnosis not present

## 2021-02-01 DIAGNOSIS — I10 Essential (primary) hypertension: Secondary | ICD-10-CM | POA: Diagnosis not present

## 2021-02-17 ENCOUNTER — Emergency Department (HOSPITAL_COMMUNITY): Payer: Medicare Other

## 2021-02-17 ENCOUNTER — Observation Stay (HOSPITAL_COMMUNITY)
Admission: EM | Admit: 2021-02-17 | Discharge: 2021-02-17 | Disposition: A | Discharge status: Home / Self Care | Payer: Medicare Other | Attending: Internal Medicine | Admitting: Internal Medicine

## 2021-02-17 ENCOUNTER — Other Ambulatory Visit: Payer: Self-pay

## 2021-02-17 ENCOUNTER — Encounter (HOSPITAL_COMMUNITY): Payer: Self-pay | Admitting: Emergency Medicine

## 2021-02-17 DIAGNOSIS — F419 Anxiety disorder, unspecified: Secondary | ICD-10-CM | POA: Insufficient documentation

## 2021-02-17 DIAGNOSIS — E875 Hyperkalemia: Secondary | ICD-10-CM | POA: Diagnosis not present

## 2021-02-17 DIAGNOSIS — R4182 Altered mental status, unspecified: Secondary | ICD-10-CM | POA: Insufficient documentation

## 2021-02-17 DIAGNOSIS — R404 Transient alteration of awareness: Secondary | ICD-10-CM | POA: Diagnosis not present

## 2021-02-17 DIAGNOSIS — E8721 Acute metabolic acidosis: Secondary | ICD-10-CM | POA: Diagnosis not present

## 2021-02-17 DIAGNOSIS — F32A Depression, unspecified: Secondary | ICD-10-CM | POA: Diagnosis not present

## 2021-02-17 DIAGNOSIS — D649 Anemia, unspecified: Secondary | ICD-10-CM | POA: Insufficient documentation

## 2021-02-17 DIAGNOSIS — Z91040 Latex allergy status: Secondary | ICD-10-CM | POA: Diagnosis not present

## 2021-02-17 DIAGNOSIS — Z20822 Contact with and (suspected) exposure to covid-19: Secondary | ICD-10-CM | POA: Diagnosis not present

## 2021-02-17 DIAGNOSIS — A419 Sepsis, unspecified organism: Principal | ICD-10-CM | POA: Diagnosis present

## 2021-02-17 DIAGNOSIS — Z79899 Other long term (current) drug therapy: Secondary | ICD-10-CM | POA: Diagnosis not present

## 2021-02-17 DIAGNOSIS — E872 Acidosis, unspecified: Secondary | ICD-10-CM | POA: Diagnosis present

## 2021-02-17 DIAGNOSIS — K7200 Acute and subacute hepatic failure without coma: Secondary | ICD-10-CM | POA: Diagnosis not present

## 2021-02-17 DIAGNOSIS — J9601 Acute respiratory failure with hypoxia: Secondary | ICD-10-CM | POA: Diagnosis not present

## 2021-02-17 DIAGNOSIS — K922 Gastrointestinal hemorrhage, unspecified: Secondary | ICD-10-CM | POA: Insufficient documentation

## 2021-02-17 DIAGNOSIS — R6520 Severe sepsis without septic shock: Secondary | ICD-10-CM | POA: Diagnosis not present

## 2021-02-17 DIAGNOSIS — I10 Essential (primary) hypertension: Secondary | ICD-10-CM | POA: Diagnosis not present

## 2021-02-17 DIAGNOSIS — I499 Cardiac arrhythmia, unspecified: Secondary | ICD-10-CM | POA: Diagnosis not present

## 2021-02-17 DIAGNOSIS — K7290 Hepatic failure, unspecified without coma: Secondary | ICD-10-CM | POA: Diagnosis present

## 2021-02-17 DIAGNOSIS — Z743 Need for continuous supervision: Secondary | ICD-10-CM | POA: Diagnosis not present

## 2021-02-17 DIAGNOSIS — R7989 Other specified abnormal findings of blood chemistry: Secondary | ICD-10-CM | POA: Diagnosis present

## 2021-02-17 DIAGNOSIS — I517 Cardiomegaly: Secondary | ICD-10-CM | POA: Diagnosis not present

## 2021-02-17 DIAGNOSIS — R4781 Slurred speech: Secondary | ICD-10-CM | POA: Diagnosis not present

## 2021-02-17 DIAGNOSIS — N179 Acute kidney failure, unspecified: Secondary | ICD-10-CM | POA: Diagnosis not present

## 2021-02-17 LAB — COMPREHENSIVE METABOLIC PANEL
ALT: 357 U/L — ABNORMAL HIGH (ref 0–44)
AST: 991 U/L — ABNORMAL HIGH (ref 15–41)
Albumin: 2 g/dL — ABNORMAL LOW (ref 3.5–5.0)
Alkaline Phosphatase: 1501 U/L — ABNORMAL HIGH (ref 38–126)
Anion gap: 26 — ABNORMAL HIGH (ref 5–15)
BUN: 316 mg/dL — ABNORMAL HIGH (ref 8–23)
CO2: 9 mmol/L — ABNORMAL LOW (ref 22–32)
Calcium: 8.5 mg/dL — ABNORMAL LOW (ref 8.9–10.3)
Chloride: 100 mmol/L (ref 98–111)
Creatinine, Ser: 21.04 mg/dL — ABNORMAL HIGH (ref 0.44–1.00)
GFR, Estimated: 2 mL/min — ABNORMAL LOW (ref >60)
Glucose, Bld: 171 mg/dL — ABNORMAL HIGH (ref 70–99)
Potassium: 8.1 mmol/L (ref 3.5–5.1)
Sodium: 135 mmol/L (ref 135–145)
Total Bilirubin: 33.9 mg/dL (ref 0.3–1.2)
Total Protein: 6.1 g/dL — ABNORMAL LOW (ref 6.5–8.1)

## 2021-02-17 LAB — CBC WITH DIFFERENTIAL/PLATELET
Basophils Absolute: 0 10*3/uL (ref 0.0–0.1)
Basophils Relative: 0 %
Eosinophils Absolute: 0 10*3/uL (ref 0.0–0.5)
Eosinophils Relative: 0 %
HCT: 24.5 % — ABNORMAL LOW (ref 36.0–46.0)
Hemoglobin: 7.8 g/dL — ABNORMAL LOW (ref 12.0–15.0)
Lymphocytes Relative: 6 %
Lymphs Abs: 1.2 10*3/uL (ref 0.7–4.0)
MCH: 28 pg (ref 26.0–34.0)
MCHC: 31.8 g/dL (ref 30.0–36.0)
MCV: 87.8 fL (ref 80.0–100.0)
Metamyelocytes Relative: 2 %
Monocytes Absolute: 0.4 10*3/uL (ref 0.1–1.0)
Monocytes Relative: 2 %
Neutro Abs: 17.7 10*3/uL — ABNORMAL HIGH (ref 1.7–7.7)
Neutrophils Relative %: 90 %
Platelets: 358 10*3/uL (ref 150–400)
RBC: 2.79 MIL/uL — ABNORMAL LOW (ref 3.87–5.11)
RDW: 25.9 % — ABNORMAL HIGH (ref 11.5–15.5)
WBC: 19.7 10*3/uL — ABNORMAL HIGH (ref 4.0–10.5)
nRBC: 5.9 % — ABNORMAL HIGH (ref 0.0–0.2)

## 2021-02-17 LAB — BLOOD GAS, ARTERIAL
Acid-base deficit: 20.6 mmol/L — ABNORMAL HIGH (ref 0.0–2.0)
Bicarbonate: 8.7 mmol/L — ABNORMAL LOW (ref 20.0–28.0)
FIO2: 100
O2 Saturation: 99.1 %
Patient temperature: 33.1
pCO2 arterial: 26.5 mmHg — ABNORMAL LOW (ref 32.0–48.0)
pH, Arterial: 7.077 — CL (ref 7.350–7.450)
pO2, Arterial: 414 mmHg — ABNORMAL HIGH (ref 83.0–108.0)

## 2021-02-17 LAB — URINALYSIS, ROUTINE W REFLEX MICROSCOPIC
Bilirubin Urine: NEGATIVE
Glucose, UA: NEGATIVE mg/dL
Ketones, ur: NEGATIVE mg/dL
Leukocytes,Ua: NEGATIVE
Nitrite: NEGATIVE
Protein, ur: NEGATIVE mg/dL
Specific Gravity, Urine: 1.013 (ref 1.005–1.030)
pH: 5 (ref 5.0–8.0)

## 2021-02-17 LAB — PROTIME-INR
INR: 3.5 — ABNORMAL HIGH (ref 0.8–1.2)
Prothrombin Time: 34.7 seconds — ABNORMAL HIGH (ref 11.4–15.2)

## 2021-02-17 LAB — APTT: aPTT: 64 seconds — ABNORMAL HIGH (ref 24–36)

## 2021-02-17 LAB — SALICYLATE LEVEL: Salicylate Lvl: 16.5 mg/dL (ref 7.0–30.0)

## 2021-02-17 LAB — POC OCCULT BLOOD, ED: Fecal Occult Bld: POSITIVE — AB

## 2021-02-17 LAB — LACTIC ACID, PLASMA
Lactic Acid, Venous: 3.3 mmol/L (ref 0.5–1.9)
Lactic Acid, Venous: 5.9 mmol/L (ref 0.5–1.9)

## 2021-02-17 LAB — RESP PANEL BY RT-PCR (FLU A&B, COVID) ARPGX2
Influenza A by PCR: NEGATIVE
Influenza B by PCR: NEGATIVE
SARS Coronavirus 2 by RT PCR: NEGATIVE

## 2021-02-17 LAB — ABO/RH: ABO/RH(D): AB POS

## 2021-02-17 LAB — TYPE AND SCREEN
ABO/RH(D): AB POS
Antibody Screen: NEGATIVE

## 2021-02-17 LAB — ACETAMINOPHEN LEVEL: Acetaminophen (Tylenol), Serum: <10 ug/mL — ABNORMAL LOW (ref 10–30)

## 2021-02-17 MED ORDER — LORAZEPAM 2 MG/ML IJ SOLN: 1.0000 mg | INTRAMUSCULAR | Status: DC | PRN

## 2021-02-17 MED ORDER — LACTATED RINGERS IV BOLUS (SEPSIS)
1000.0000 mL | Freq: Once | INTRAVENOUS | Status: AC
  Administered 2021-02-17: 1000 mL via INTRAVENOUS

## 2021-02-17 MED ORDER — GLYCOPYRROLATE 0.2 MG/ML IJ SOLN: 0.2000 mg | INTRAMUSCULAR | Status: DC | PRN

## 2021-02-17 MED ORDER — HALOPERIDOL LACTATE 5 MG/ML IJ SOLN: 0.5000 mg | INTRAMUSCULAR | Status: DC | PRN

## 2021-02-17 MED ORDER — CALCIUM CHLORIDE 10 % IV SOLN: 1.0000 g | Freq: Once | INTRAVENOUS | Status: AC

## 2021-02-17 MED ORDER — ETOMIDATE 2 MG/ML IV SOLN: 30.0000 mg | Freq: Once | INTRAVENOUS | Status: AC

## 2021-02-17 MED ORDER — PANTOPRAZOLE SODIUM 40 MG IV SOLR
40.0000 mg | Freq: Once | INTRAVENOUS | Status: AC
  Administered 2021-02-17: 40 mg via INTRAVENOUS
  Filled 2021-02-17: qty 40

## 2021-02-17 MED ORDER — HALOPERIDOL LACTATE 2 MG/ML PO CONC
0.5000 mg | ORAL | Status: DC | PRN
  Filled 2021-02-17: qty 0.3

## 2021-02-17 MED ORDER — MIDAZOLAM HCL 2 MG/2ML IJ SOLN: 1.0000 mg | INTRAMUSCULAR | Status: DC | PRN

## 2021-02-17 MED ORDER — GLYCOPYRROLATE 1 MG PO TABS
1.0000 mg | ORAL_TABLET | ORAL | Status: DC | PRN
  Filled 2021-02-17: qty 1

## 2021-02-17 MED ORDER — STERILE WATER FOR INJECTION IV SOLN
INTRAVENOUS | Status: DC
  Filled 2021-02-17 (×3): qty 1000

## 2021-02-17 MED ORDER — MORPHINE SULFATE (PF) 2 MG/ML IV SOLN
1.0000 mg | INTRAVENOUS | Status: DC | PRN
Start: 2021-02-17 — End: 2021-02-18

## 2021-02-17 MED ORDER — SODIUM CHLORIDE 0.9 % IV SOLN: 1.0000 g | INTRAVENOUS | Status: DC

## 2021-02-17 MED ORDER — SODIUM CHLORIDE 0.9 % IV SOLN
2.0000 g | Freq: Once | INTRAVENOUS | Status: AC
  Administered 2021-02-17: 2 g via INTRAVENOUS
  Filled 2021-02-17: qty 2

## 2021-02-17 MED ORDER — LACTATED RINGERS IV BOLUS (SEPSIS)
500.0000 mL | Freq: Once | INTRAVENOUS | Status: AC
  Administered 2021-02-17: 500 mL via INTRAVENOUS

## 2021-02-17 MED ORDER — METRONIDAZOLE 500 MG/100ML IV SOLN
500.0000 mg | Freq: Two times a day (BID) | INTRAVENOUS | Status: DC
  Administered 2021-02-17: 500 mg via INTRAVENOUS
  Filled 2021-02-17: qty 100

## 2021-02-17 MED ORDER — POLYVINYL ALCOHOL 1.4 % OP SOLN: 1.0000 [drp] | Freq: Four times a day (QID) | OPHTHALMIC | Status: DC | PRN

## 2021-02-17 MED ORDER — MIDAZOLAM HCL 2 MG/2ML IJ SOLN
1.0000 mg | INTRAMUSCULAR | Status: DC | PRN
  Administered 2021-02-17: 1 mg via INTRAVENOUS
  Filled 2021-02-17: qty 2

## 2021-02-17 MED ORDER — LORAZEPAM 2 MG/ML PO CONC: 1.0000 mg | ORAL | Status: DC | PRN

## 2021-02-17 MED ORDER — CALCIUM CHLORIDE 10 % IV SOLN
1.0000 g | Freq: Once | INTRAVENOUS | Status: AC
  Administered 2021-02-17: 1 g via INTRAVENOUS

## 2021-02-17 MED ORDER — NOREPINEPHRINE 4 MG/250ML-% IV SOLN: 0.0000 ug/min | INTRAVENOUS | Status: DC

## 2021-02-17 MED ORDER — FENTANYL CITRATE PF 50 MCG/ML IJ SOSY
50.0000 ug | PREFILLED_SYRINGE | INTRAMUSCULAR | Status: DC | PRN
  Filled 2021-02-17: qty 1

## 2021-02-17 MED ORDER — ONDANSETRON 4 MG PO TBDP: 4.0000 mg | ORAL_TABLET | Freq: Four times a day (QID) | ORAL | Status: DC | PRN

## 2021-02-17 MED ORDER — ROCURONIUM BROMIDE 10 MG/ML (PF) SYRINGE
PREFILLED_SYRINGE | INTRAVENOUS | Status: AC
  Administered 2021-02-17: 100 mg via INTRAVENOUS
  Filled 2021-02-17: qty 10

## 2021-02-17 MED ORDER — NOREPINEPHRINE 4 MG/250ML-% IV SOLN
INTRAVENOUS | Status: AC
  Administered 2021-02-17: 10 ug/min via INTRAVENOUS
  Filled 2021-02-17: qty 250

## 2021-02-17 MED ORDER — CALCIUM CHLORIDE 10 % IV SOLN
INTRAVENOUS | Status: AC
  Administered 2021-02-17: 1 g via INTRAVENOUS
  Filled 2021-02-17: qty 10

## 2021-02-17 MED ORDER — HALOPERIDOL 0.5 MG PO TABS: 0.5000 mg | ORAL_TABLET | ORAL | Status: DC | PRN

## 2021-02-17 MED ORDER — ONDANSETRON HCL 4 MG/2ML IJ SOLN: 4.0000 mg | Freq: Four times a day (QID) | INTRAMUSCULAR | Status: DC | PRN

## 2021-02-17 MED ORDER — LORAZEPAM 1 MG PO TABS: 1.0000 mg | ORAL_TABLET | ORAL | Status: DC | PRN

## 2021-02-17 MED ORDER — FENTANYL CITRATE PF 50 MCG/ML IJ SOSY
50.0000 ug | PREFILLED_SYRINGE | INTRAMUSCULAR | Status: AC | PRN
  Administered 2021-02-17 (×3): 50 ug via INTRAVENOUS
  Filled 2021-02-17 (×2): qty 1

## 2021-02-17 MED ORDER — ETOMIDATE 2 MG/ML IV SOLN
INTRAVENOUS | Status: AC
  Administered 2021-02-17: 30 mg via INTRAVENOUS
  Filled 2021-02-17: qty 20

## 2021-02-17 MED ORDER — ROCURONIUM BROMIDE 50 MG/5ML IV SOLN
100.0000 mg | Freq: Once | INTRAVENOUS | Status: AC
  Filled 2021-02-17: qty 10

## 2021-02-17 MED ORDER — HYDROMORPHONE HCL 1 MG/ML IJ SOLN
0.5000 mg | INTRAMUSCULAR | Status: DC | PRN
Start: 2021-02-17 — End: 2021-02-18

## 2021-02-22 LAB — CULTURE, BLOOD (ROUTINE X 2)
Culture: NO GROWTH
Culture: NO GROWTH
Special Requests: ADEQUATE
Special Requests: ADEQUATE

## 2021-03-05 NOTE — ED Provider Notes (Signed)
[facility] EMERGENCY DEPARTMENT Provider Note   CSN: [id number] Arrival date & time: 2021/03/04  1443     History Chief Complaint  Patient presents with   Altered Mental Status    Abigail Montgomery is a 70 y.o. female.  EMS picked pt up from home.  Pt had dark blood in mouth and body is covered in black stool.  Pt's eyes are jaundiced. Skin is jaundiced.  Husband reported pt decreased responsive for 2 days.    The history is provided by the spouse, the EMS personnel and a relative. No language interpreter was used.  Altered Mental Status Presenting symptoms: unresponsiveness   Severity:  Severe Most recent episode:  2 days ago     Past Medical History:  Diagnosis Date   Abdominal pain    Anxiety    Arthritis    Back pain    Chronic knee pain    Complication of anesthesia    DDD (degenerative disc disease), cervical    Depression    Heart murmur    Hypertension    Knee pain    Left ankle pain    Left arm pain    Low back pain    Peripheral neuropathy    PONV (postoperative nausea and vomiting)    Radiculopathy     Patient Active Problem List   Diagnosis Date Noted   Anal itching 12/30/2018   Anal burning 12/30/2018   Chronic RLQ pain 08/04/2017   Colon cancer screening 08/04/2017   Incisional hernia 08/18/2014   Arthritis of knee, degenerative 10/09/2010    Past Surgical History:  Procedure Laterality Date   ABDOMINAL HYSTERECTOMY     COLONOSCOPY WITH PROPOFOL N/A 08/24/2017   Diverticulosis in the entire colon. Otherwise normal. Repeat in 10 years.    cyst removed from lower abdomen     INCISIONAL HERNIA REPAIR N/A 08/18/2014   Procedure: Sherald Hess HERNIORRHAPHY WITH MESH;  Surgeon: Franky Macho Md, MD;  Location: [facility];  Service: General;  Laterality: N/A;   INSERTION OF MESH N/A 08/18/2014   Procedure: INSERTION OF MESH;  Surgeon: Franky Macho Md, MD;  Location: [facility];  Service: General;  Laterality: N/A;   TONSILECTOMY, ADENOIDECTOMY, BILATERAL  MYRINGOTOMY AND TUBES     UTERINE FIBROID SURGERY       OB History     Gravida  2   Para  2   Term  2   Preterm      AB      Living  2      SAB      IAB      Ectopic      Multiple      Live Births  2           Family History  Problem Relation Age of Onset   Heart disease Mother    Parkinson's disease Father    Colon polyps Father        >60   Heart disease Other    Arthritis Other    Diabetes Other    Kidney disease Other    Colon cancer Neg Hx     Social History   Tobacco Use   Smoking status: Never   Smokeless tobacco: Never  Substance Use Topics   Alcohol use: Yes    Comment: occ   Drug use: No    Home Medications Prior to Admission medications   Medication Sig Start Date End Date Taking? Authorizing Provider  albuterol (PROVENTIL HFA;VENTOLIN HFA)  108 (90 BASE) MCG/ACT inhaler Inhale 1-2 puffs into the lungs every 6 (six) hours as needed for wheezing or shortness of breath.     [provider]  Ascorbic Acid (VITAMIN C) 1000 MG tablet Take 1,000 mg by mouth daily as needed (Immune support).     [provider]  citalopram (CELEXA) 20 MG tablet Take 20 mg by mouth daily.    [provider]  conjugated estrogens (PREMARIN) vaginal cream Place vaginally 3 (three) times a week. Use 0.5 grams. Per treatment. 03/04/19   Tilda Burrow, MD  furosemide (LASIX) 20 MG tablet Take 20 mg by mouth daily as needed for fluid.     [provider]  hydrocortisone (ANUSOL-HC) 2.5 % rectal cream Place 1 application rectally 2 (two) times daily. 12/30/18   Letta Median, PA-C  hydrOXYzine (VISTARIL) 50 MG capsule Take 50 mg by mouth every 6 (six) hours as needed for anxiety.    [provider]  lisinopril-hydrochlorothiazide (PRINZIDE,ZESTORETIC) 20-12.5 MG per tablet Take 1 tablet by mouth daily.    [provider]  Magnesium 250 MG TABS Take 250 mg by mouth daily as needed (cramps).     [provider]  Menthol, Topical Analgesic, (ICY HOT EX) Apply 1 application topically daily as needed (pain).    [provider]  MYRBETRIQ 25 MG TB24 tablet Take 1 tablet by mouth daily. 11/08/18   [provider]  naproxen sodium (ALEVE) 220 MG tablet Take 440 mg by mouth daily as needed (pain).    [provider]  NYSTATIN powder Apply topically 3 (three) times daily as needed (for irritation).  03/05/19   [provider]  oxybutynin (DITROPAN) 5 MG tablet Take 5 mg by mouth 2 (two) times daily. 01/05/19   [provider]  oxyCODONE (OXY IR/ROXICODONE) 5 MG immediate release tablet Take 5 mg by mouth daily as needed for severe pain.    [provider]  Polyvinyl Alcohol-Povidone (REFRESH OP) Place 1 drop into both eyes 3 (three) times daily.    [provider]  Potassium 99 MG TABS Take 99 mg by mouth daily as needed (cramps).    [provider]    Allergies    Latex  Review of Systems   Review of Systems  Unable to perform ROS: Acuity of condition   Physical Exam Updated Vital Signs Pulse 72   Temp (!) 92.5 F (33.6 C)   Resp (!) 22   Ht 5\' 5"  (1.651 m)   SpO2 96%   BMI 49.09 kg/m   Physical Exam Vitals reviewed.  Constitutional:      Appearance: She is ill-appearing and toxic-appearing.  HENT:     Head: Normocephalic and atraumatic.     Mouth/Throat:     Mouth: Mucous membranes are dry.     Comments: Black blood  in mouth  Eyes:     General: Scleral icterus present.  Cardiovascular:     Rate and Rhythm: Normal rate.  Pulmonary:     Comments: Decreased oxygen sats,  Genitourinary:    Rectum: Guaiac result positive.     Comments: Dark/black stool hemocult positive  Neurological:     Mental Status: She is disoriented.    ED Results / Procedures / Treatments   Labs (all labs ordered are listed, but only abnormal results are displayed) Labs Reviewed  CBC WITH DIFFERENTIAL/PLATELET - Abnormal;  Notable for the following components:      Result Value  WBC 19.7 (*)    RBC 2.79 (*)    Hemoglobin 7.8 (*)    HCT 24.5 (*)    RDW 25.9 (*)    nRBC 5.9 (*)    Neutro Abs 17.7 (*)    All other components within normal limits  COMPREHENSIVE METABOLIC PANEL - Abnormal; Notable for the following components:   Potassium 8.1 (*)    CO2 9 (*)    Glucose, Bld 171 (*)    BUN 316 (*)    Creatinine, Ser 21.04 (*)    Calcium 8.5 (*)    Total Protein 6.1 (*)    Albumin 2.0 (*)    AST 991 (*)    ALT 357 (*)    Alkaline Phosphatase 1,501 (*)    GFR, Estimated 2 (*)    Anion gap 26 (*)    All other components within normal limits  LACTIC ACID, PLASMA - Abnormal; Notable for the following components:   Lactic Acid, Venous 3.3 (*)    All other components within normal limits  PROTIME-INR - Abnormal; Notable for the following components:   Prothrombin Time 34.7 (*)    INR 3.5 (*)    All other components within normal limits  APTT - Abnormal; Notable for the following components:   aPTT 64 (*)    All other components within normal limits  URINALYSIS, ROUTINE W REFLEX MICROSCOPIC - Abnormal; Notable for the following components:   Color, Urine AMBER (*)    APPearance HAZY (*)    Hgb urine dipstick LARGE (*)    Bacteria, UA MANY (*)    All other components within normal limits  POC OCCULT BLOOD, ED - Abnormal; Notable for the following components:   Fecal Occult Bld POSITIVE (*)    All other components within normal limits  CULTURE, BLOOD (ROUTINE X 2)  CULTURE, BLOOD (ROUTINE X 2)  LACTIC ACID, PLASMA  BLOOD GAS, ARTERIAL  I-STAT CHEM 8, ED  TYPE AND SCREEN    EKG EKG Interpretation  Date/Time:  Sunday Mar 17, 2021 15:20:07 EDT Ventricular Rate:  57 PR Interval:  197 QRS Duration: 137 QT Interval:  449 QTC Calculation: 438 R Axis:   11 Text Interpretation: Sinus rhythm Left bundle branch block Baseline wander in lead(s) I II aVR Confirmed by Pricilla Loveless [phone]) on  03/17/21 5:01:56 PM  Radiology No results found.  Procedures .Critical Care Performed by: Elson Areas, PA-C Authorized by: Elson Areas, PA-C   Critical care provider statement:    Critical care time (minutes):  75   Critical care start time:  2021-03-17 3:20 PM   Critical care end time:  03-17-2021 5:19 PM   Critical care was necessary to treat or prevent imminent or life-threatening deterioration of the following conditions:  Cardiac failure, renal failure, respiratory failure, sepsis, dehydration, hepatic failure, metabolic crisis and circulatory failure   Critical care was time spent personally by me on the following activities:  Obtaining history from patient or surrogate, interpretation of cardiac output measurements, examination of patient, evaluation of patient's response to treatment, blood draw for specimens, development of treatment plan with patient or surrogate, ordering and performing treatments and interventions, ordering and review of laboratory studies, ordering and review of radiographic studies, pulse oximetry, re-evaluation of patient's condition, review of old charts, vascular access procedures and ventilator management   Care discussed with: admitting provider     Medications Ordered in ED Medications  ceFEPIme (MAXIPIME) 2 g in sodium chloride 0.9 % 100 mL  IVPB (2 g Intravenous New Bag/Given 03-Mar-2021 1639)  metroNIDAZOLE (FLAGYL) IVPB 500 mg (500 mg Intravenous New Bag/Given Mar 03, 2021 1635)  lactated ringers bolus 1,000 mL (1,000 mLs Intravenous New Bag/Given March 03, 2021 1628)    And  lactated ringers bolus 1,000 mL (1,000 mLs Intravenous New Bag/Given March 03, 2021 1628)    And  lactated ringers bolus 1,000 mL (has no administration in time range)    And  lactated ringers bolus 1,000 mL (has no administration in time range)    And  lactated ringers bolus 500 mL (has no administration in time range)  norepinephrine (LEVOPHED) 4mg  in premix infusion (15  mcg/min Intravenous Rate/Dose Change 2021-03-03 1655)  fentaNYL (SUBLIMAZE) injection 50 mcg (50 mcg Intravenous Given Mar 03, 2021 1636)  fentaNYL (SUBLIMAZE) injection 50 mcg (has no administration in time range)  midazolam (VERSED) injection 1 mg (has no administration in time range)  midazolam (VERSED) injection 1 mg (has no administration in time range)  calcium chloride injection 1 g (has no administration in time range)  pantoprazole (PROTONIX) injection 40 mg (40 mg Intravenous Given Mar 03, 2021 1626)  etomidate (AMIDATE) injection 30 mg (30 mg Intravenous Given March 03, 2021 1605)  rocuronium (ZEMURON) injection 100 mg (100 mg Intravenous Given 2021/03/03 1606)  calcium chloride injection 1 g (1 g Intravenous Given 2021/03/03 1644)    ED Course  I have reviewed the triage vital signs and the nursing notes.  Pertinent labs & imaging results that were available during my care of the patient were reviewed by me and considered in my medical decision making (see chart for details).    MDM Rules/Calculators/A&P                           MDM:  I spoke to Pt's husband and sister and older brother.  They state pt would want intubation and ressustation including chest compressions.  Pt has refuses blood product documentation on chart.  Pt's husband states he would want pt to have blood if necessary. Pt required intubation due to airway compromise.  Pt's blood pressure 50/.  Central line and arterial line started by Dr. 02/19/21.  IV fluid bolus given, antibiotics for sepsis.  Pt started on levophed drip.     Pt's laboratory evaluation returning.  Dr. Criss Alvine spoke to critical care.  Critical care advised pt can be admitted here. Dr. Criss Alvine spoke to family regarding transitioning to comfort care.  Pt has multi organ failure.  Hospitalist consulted for admission   Final Clinical Impression(s) / ED Diagnoses Final diagnoses:  Sepsis (HCC)  Acute renal failure, unspecified acute renal failure type (HCC)   Acute liver failure without hepatic coma  Acute respiratory failure with hypoxia (HCC)  Anemia, unspecified type  Altered mental status, unspecified altered mental status type  Hyperkalemia    Rx / DC Orders ED Discharge Orders     None        Criss Alvine, Elson Areas 03/03/2021 1821    02/19/21, MD 02/18/21 1552

## 2021-03-05 NOTE — Progress Notes (Signed)
Pharmacy Antibiotic Note  Abigail Montgomery is a 70 y.o. female admitted on 03/02/2021 with  intra-abdominal infection .  Pharmacy has been consulted for Cefepime dosing.  Plan: Cefepime 2gm IV x 1 , then 1gm IV q24h F/U cxs and clinical progress Monitor V/S, labs  Height: 5\' 3"  (160 cm) Weight: 134 kg (295 lb 6.7 oz) IBW/kg (Calculated) : 52.4  Temp (24hrs), Avg:92.9 F (33.8 C), Min:92.5 F (33.6 C), Max:93.4 F (34.1 C)  Recent Labs  Lab 03-02-21 1517 03-02-2021 1518  WBC 19.7*  --   CREATININE 21.04*  --   LATICACIDVEN  --  3.3*    Estimated Creatinine Clearance: 3.2 mL/min (A) (by C-G formula based on SCr of 21.04 mg/dL (H)).    Allergies  Allergen Reactions   Latex Rash    Thank you for allowing pharmacy to be a part of this patient's care.  02/19/21, BS [name], Loura Back Clinical Pharmacist Pager [phone]  03/02/21 6:23 PM

## 2021-03-05 NOTE — Progress Notes (Signed)
Code Sepsis monitoring discontinued due to cancellation.  Patient being made comfort care

## 2021-03-05 NOTE — ED Notes (Signed)
Date and time results received: 02-24-2021 1803 (use smartphrase ".now" to insert current time)  Test: Ph Critical Value: 7.077  Name of Provider Notified: Dr. Criss Alvine  Orders Received? Or Actions Taken?: see chart

## 2021-03-05 NOTE — ED Triage Notes (Signed)
Pt from home via [facility]. Ems called for AMS. Upon EMS arrival pt was covered in feces. Pt is alert but no purposeful movement. Per family pt is normally alert and oriented. Pt was last seen "sometime last night."

## 2021-03-05 NOTE — H&P (Signed)
History and Physical    Abigail Montgomery RFX:[id number] DOB: [birth year] DOA: 2021/03/18  PCP: Benita Stabile, MD   Patient coming from: Home  I have personally briefly reviewed patient's old medical records in [facility]  Chief Complaint: AMS  HPI: Abigail Montgomery is a 70 y.o. female with medical history significant for anxiety, depression, hypertension. Patient was brought to the ED with altered mental status.  At the time of my evaluation patient is intubated, several family members at bedside. History is obtained mostly from ED provider report.  Spouse is present at bedside but not able to give me any significant history give specific answers to questions.  On arrival to the ED, patient was covered in black feces, with dark blood in her mouth.  Eyes and skin was severely jaundiced.  Husband had reported patient had decreased responsiveness for 2 days.  Spouse is unsure when patient started vomiting blood, and had not noticed patient's jaundiced eyes as her eyes had been closed .  Patient had not been feeling well for about a week, spouse said over the past 2 to 3 days she got worse, his not sure when last he saw patient.  ED Course: Temperature down to 92.7, heart rate 48-72.  Due to altered mental status and unresponsive, patient was immediately intubated in the ED, for airway protection. Blood work revealed marked metabolic dysfunction, with creatinine of 21.04, potassium 8.1.  Anion gap 26, serum bicarb 9, lactic acidosis 3.3  > 5.9.  Markedly elevated liver enzymes ALP 1501, total bilirubin of 33.9.  Central and arterial line were placed, patient became hypotensive, was started on Levophed drip. Broad-spectrum antibiotics were started. EDP talked to intensivist on-call at [facility], recommended palliative/comfort care,  Nothing more could be done. ED provider spoke to patient's spouse, and family at bedside, they agreed to palliative and comfort care, but wanted to hold off on terminal  extubation and withdrawal of pressors until a family member-grandson arrived.  Review of Systems: Unable to assess due to altered mental status, on ventilator  Past Medical History:  Diagnosis Date   Abdominal pain    Anxiety    Arthritis    Back pain    Chronic knee pain    Complication of anesthesia    DDD (degenerative disc disease), cervical    Depression    Heart murmur    Hypertension    Knee pain    Left ankle pain    Left arm pain    Low back pain    Peripheral neuropathy    PONV (postoperative nausea and vomiting)    Radiculopathy     Past Surgical History:  Procedure Laterality Date   ABDOMINAL HYSTERECTOMY     COLONOSCOPY WITH PROPOFOL N/A 08/24/2017   Diverticulosis in the entire colon. Otherwise normal. Repeat in 10 years.    cyst removed from lower abdomen     INCISIONAL HERNIA REPAIR N/A 08/18/2014   Procedure: Sherald Hess HERNIORRHAPHY WITH MESH;  Surgeon: Franky Macho Md, MD;  Location: [facility];  Service: General;  Laterality: N/A;   INSERTION OF MESH N/A 08/18/2014   Procedure: INSERTION OF MESH;  Surgeon: Franky Macho Md, MD;  Location: [facility];  Service: General;  Laterality: N/A;   TONSILECTOMY, ADENOIDECTOMY, BILATERAL MYRINGOTOMY AND TUBES     UTERINE FIBROID SURGERY       reports that she has never smoked. She has never used smokeless tobacco. She reports current alcohol use. She reports that she does not  use drugs.  Allergies  Allergen Reactions   Latex Rash    Family History  Problem Relation Age of Onset   Heart disease Mother    Parkinson's disease Father    Colon polyps Father        >60   Heart disease Other    Arthritis Other    Diabetes Other    Kidney disease Other    Colon cancer Neg Hx     Prior to Admission medications   Medication Sig Start Date End Date Taking? Authorizing Provider  albuterol (PROVENTIL HFA;VENTOLIN HFA) 108 (90 BASE) MCG/ACT inhaler Inhale 1-2 puffs into the lungs every 6 (six) hours as needed for wheezing  or shortness of breath.     [provider]  Ascorbic Acid (VITAMIN C) 1000 MG tablet Take 1,000 mg by mouth daily as needed (Immune support).     [provider]  citalopram (CELEXA) 20 MG tablet Take 20 mg by mouth daily.    [provider]  conjugated estrogens (PREMARIN) vaginal cream Place vaginally 3 (three) times a week. Use 0.5 grams. Per treatment. 03/04/19   Tilda Burrow, MD  furosemide (LASIX) 20 MG tablet Take 20 mg by mouth daily as needed for fluid.     [provider]  hydrocortisone (ANUSOL-HC) 2.5 % rectal cream Place 1 application rectally 2 (two) times daily. 12/30/18   Letta Median, PA-C  hydrOXYzine (VISTARIL) 50 MG capsule Take 50 mg by mouth every 6 (six) hours as needed for anxiety.    [provider]  lisinopril-hydrochlorothiazide (PRINZIDE,ZESTORETIC) 20-12.5 MG per tablet Take 1 tablet by mouth daily.    [provider]  Magnesium 250 MG TABS Take 250 mg by mouth daily as needed (cramps).     [provider]  Menthol, Topical Analgesic, (ICY HOT EX) Apply 1 application topically daily as needed (pain).    [provider]  MYRBETRIQ 25 MG TB24 tablet Take 1 tablet by mouth daily. 11/08/18   [provider]  naproxen sodium (ALEVE) 220 MG tablet Take 440 mg by mouth daily as needed (pain).    [provider]  NYSTATIN powder Apply topically 3 (three) times daily as needed (for irritation).  03/05/19   [provider]  oxybutynin (DITROPAN) 5 MG tablet Take 5 mg by mouth 2 (two) times daily. 01/05/19   [provider]  oxyCODONE (OXY IR/ROXICODONE) 5 MG immediate release tablet Take 5 mg by mouth daily as needed for severe pain.    [provider]  Polyvinyl Alcohol-Povidone (REFRESH OP) Place 1 drop into both eyes 3 (three) times daily.    [provider]  Potassium 99 MG TABS Take 99 mg by mouth daily as needed (cramps).    [provider]  potassium chloride (KLOR-CON) 10 MEQ tablet Take 10 mEq by mouth daily as needed (for cramps/or with furosemide).    [provider]  SSD 1 % cream Apply topically 2 (two) times daily. 12/05/20   [provider]    Physical Exam: Limited exam as patient is intubated, altered, obtunded. Vitals:   03/13/2021 1652 13-Mar-2021 1653 2021-03-13 1700 03-13-2021 2030  Pulse:      Resp: (!) 22 (!) 22    Temp: (!) 92.5 F (33.6 C) (!) 92.5 F (33.6 C)    TempSrc:      SpO2:      Weight:   134 kg 133.8 kg  Height:   5\' 3"  (1.6 m)  5\' 3"  (1.6 m)    Constitutional: Acutely ill-appearing.  Intubated.  Unresponsive Vitals:   03-16-2021 1652 03/16/2021 1653 March 16, 2021 1700 16-Mar-2021 2030  Pulse:      Resp: (!) 22 (!) 22    Temp: (!) 92.5 F (33.6 C) (!) 92.5 F (33.6 C)    TempSrc:      SpO2:      Weight:   134 kg 133.8 kg  Height:   5\' 3"  (1.6 m) 5\' 3"  (1.6 m)   Eyes: Markedly jaundiced conjunctiva, pupils equal ENMT: Intubated. Neck: No masses. Respiratory: Intubated, equal breath sounds bilaterally.  Cardiovascular: Regular rate and rhythm, no extremity edema Abdomen: obese,  no masses palpated. Musculoskeletal: No obvious joint deformity  skin: no rashes, lesions, ulcers. No induration Neurologic: Intubated. Psychiatric: Intubated.  Labs on Admission: I have personally reviewed following labs and imaging studies  CBC: Recent Labs  Lab 03-16-2021 1517  WBC 19.7*  NEUTROABS 17.7*  HGB 7.8*  HCT 24.5*  MCV 87.8  PLT 358   Basic Metabolic Panel: Recent Labs  Lab March 16, 2021 1517  NA 135  K 8.1*  CL 100  CO2 9*  GLUCOSE 171*  BUN 316*  CREATININE 21.04*  CALCIUM 8.5*   Liver Function Tests: Recent Labs  Lab 03/16/2021 1517  AST 991*  ALT 357*  ALKPHOS 1,501*  BILITOT 33.9*  PROT 6.1*  ALBUMIN 2.0*   Coagulation Profile: Recent Labs  Lab Mar 16, 2021 1517  INR 3.5*   Urine analysis:    Component Value Date/Time   COLORURINE AMBER (A) 03/16/21 1552    APPEARANCEUR HAZY (A) 16-Mar-2021 1552   LABSPEC 1.013 03/16/21 1552   PHURINE 5.0 03-16-21 1552   GLUCOSEU NEGATIVE 2021/03/16 1552   HGBUR LARGE (A) 2021/03/16 1552   BILIRUBINUR NEGATIVE 2021/03/16 1552   KETONESUR NEGATIVE 03/16/21 1552   PROTEINUR NEGATIVE 03-16-2021 1552   UROBILINOGEN 0.2 11/04/2014 2150   NITRITE NEGATIVE 03/16/2021 1552   LEUKOCYTESUR NEGATIVE Mar 16, 2021 1552    Radiological Exams on Admission: CT ABDOMEN PELVIS WO CONTRAST  Result Date: 2021/03/16 CLINICAL DATA:  GI bleed, rectal bleeding EXAM: CT ABDOMEN AND PELVIS WITHOUT CONTRAST TECHNIQUE: Multidetector CT imaging of the abdomen and pelvis was performed following the standard protocol without IV contrast. COMPARISON:  10/26/2017 FINDINGS: Lower chest: Bilateral lower lobe airspace opacities, atelectasis or infiltrates. Heart is mildly enlarged. No effusions. Hepatobiliary: Gallbladder is distended high-density material within the gallbladder, likely stones. Intrahepatic and extrahepatic biliary ductal dilatation. Common bile duct measures up to 19 mm. No focal hepatic abnormality. Pancreas: Questionable mass in the region of the pancreatic head measuring up to 3.2 cm. No ductal dilatation. Spleen: No focal abnormality.  Normal size. Adrenals/Urinary Tract: Foley catheter in the bladder which is decompressed. No renal or adrenal mass. No stones or hydronephrosis. Stomach/Bowel: Scattered diverticula. No active diverticulitis. Stomach and small bowel decompressed, unremarkable. No bowel obstruction. NG tube tip in the stomach. Vascular/Lymphatic: No evidence of aneurysm or adenopathy. Reproductive: Prior hysterectomy.  No adnexal masses. Other: No free fluid or free air. Musculoskeletal: No acute bony abnormality. IMPRESSION: Distended gallbladder with high-density material noted, which could reflect stones or sludge. Intrahepatic and extrahepatic biliary ductal dilatation. Questionable mass in the region of the  pancreatic head. These findings could be further evaluated with ultrasound or MRI. Bibasilar atelectasis or infiltrates. Electronically Signed   By: 02/19/2021 M.D.   On: 2021/03/16 18:43   CT HEAD WO CONTRAST (10/28/2017)  Result Date: 03/16/2021 CLINICAL DATA:  Neuro deficit, acute,  stroke suspected. Altered mental status. EXAM: CT HEAD WITHOUT CONTRAST TECHNIQUE: Contiguous axial images were obtained from the base of the skull through the vertex without intravenous contrast. COMPARISON:  None. FINDINGS: Brain: No acute intracranial abnormality. Specifically, no hemorrhage, hydrocephalus, mass lesion, acute infarction, or significant intracranial injury. Vascular: No hyperdense vessel or unexpected calcification. Skull: No acute calvarial abnormality. Sinuses/Orbits: No acute findings Other: None IMPRESSION: No acute intracranial abnormality. Electronically Signed   By: Charlett Nose M.D.   On: 2021-03-11 18:45   DG Chest Port 1 View  Result Date: 03/11/21 CLINICAL DATA:  Intubation EXAM: PORTABLE CHEST 1 VIEW COMPARISON:  09/09/2010 FINDINGS: Endotracheal intubation, tip projecting above the carina. Esophagogastric tube with tip and side port below the diaphragm. Right neck vascular catheter, tip projecting over the mid SVC. Cardiomegaly. No acute abnormality of the lungs. IMPRESSION: 1. Endotracheal tube, esophagogastric tube, and right neck vascular catheter positioned appropriately. 2.  Cardiomegaly without acute abnormality of the lungs. Electronically Signed   By: Jearld Lesch M.D.   On: 03-11-21 17:19    EKG: Independently reviewed.  Sinus rhythm rate 56.  Prolonged QRS at 146.  Assessment/Plan Principal Problem:   Sepsis with multiple organ dysfunction (MOD) (HCC) Active Problems:   Acute respiratory failure with hypoxia (HCC)   Liver failure (HCC)   Acute renal failure (HCC)   Hyperkalemia   Metabolic acidosis   Multiple organ failure-difficult to tell what the inciting etiology is,  but most likely sepsis, and severe dehydration.  Presenting with severe multiple organ dysfunction including acute renal failure creatinine of 21.05, BUN 316, potassium of 8.1, liver failure, ALP 1501, AST 991, total bilirubin 33.9, encephalopathic, with cardiovascular failure -requiring pressor support with Levophed drip, respiratory failure requiring intubation for airway protection, hematemesis, melena hemoglobin 7.8 ( down 5 points from baseline ~ 12)), anion gap metabolic acidosis, with serum bicarb of 9, anion gap of 26, pH 7.0..  Worsening lactic acidosis 3.3 > 5.9 after 4.5 L bolus given. - Family initially wanted everything done, so patient was intubated, central and arterial lines were placed.  Broad-spectrum antibiotics started, sepsis protocol was initiated.] - EDP talked to intensivist at [facility], recommended comfort/palliative care. -Family agreed to comfort care measures, with terminal extubation and weaning off of pressors.  Code Status: DNR, Comfort care. Family Communication: Multiple family members at bedside, including spouse, 2 of patient's brothers, 1 of patient's sister, and a sister-in-law. Admission status: Observation, MedSurg.  Onnie Boer MD [facility]  March 11, 2021, 8:43 PM

## 2021-03-05 NOTE — ED Notes (Signed)
Pt earrings as well as silver ring given to husband.

## 2021-03-05 NOTE — ED Notes (Signed)
Date and time results received: 03-10-2021 1605  (use smartphrase ".now" to insert current time)  Test: Lactic Acid Critical Value: 3.3  Name of Provider Notified: Dr. Criss Alvine  Orders Received? Or Actions Taken?: see chart

## 2021-03-05 NOTE — Death Summary Note (Signed)
DEATH SUMMARY   Patient Details  Name: Abigail Montgomery MRN: [id number] DOB: August 30, 1950  Admission/Discharge Information   Admit Date:  2021-03-03  Date of Death: Date of Death: 2021-03-03  Time of Death: Time of Death: 2016-08-31  Length of Stay: 0  Code Status: DNR    Code Status Orders  (From admission, onward)           Start     Ordered   03/03/21 1928  Do not attempt resuscitation (DNR)  Continuous       Question Answer Comment  In the event of cardiac or respiratory ARREST Do not call a "code blue"   In the event of cardiac or respiratory ARREST Do not perform Intubation, CPR, defibrillation or ACLS   In the event of cardiac or respiratory ARREST Use medication by any route, position, wound care, and other measures to relive pain and suffering. May use oxygen, suction and manual treatment of airway obstruction as needed for comfort.      Mar 03, 2021 1928            Referring Physician: Benita Stabile, MD    Reason(s) for Hospitalization  Altered mental status.  Diagnoses  Preliminary cause of death:  Secondary Diagnoses (including complications and co-morbidities):  Principal Problem:   Sepsis with multiple organ dysfunction (MOD) (HCC) Active Problems:   Acute respiratory failure with hypoxia (HCC)   Liver failure (HCC)   Acute renal failure (HCC)   Hyperkalemia   Metabolic acidosis   Azotemia  Brief Hospital Course  Abigail Montgomery is a 69 y.o. year old female   HPI Abigail Montgomery is a 70 y.o. female with medical history significant for anxiety, depression, hypertension. Patient was brought to the ED with altered mental status.  At the time of my evaluation patient is intubated, several family members at bedside. History is obtained mostly from ED provider report.  Spouse is present at bedside but not able to give me any significant history or give specific answers to questions.  On arrival to the ED, patient was covered in black feces, with dark blood in  her mouth.  Eyes and skin was severely jaundiced.  Husband had reported patient had been ill for about a week, but with decreased responsiveness for 2 days.  Spouse is unsure when patient started vomiting blood, and had not noticed patient's jaundiced eyes as her eyes had been closed .  Patient had not been feeling well for about a week, spouse said over the past 2 to 3 days she got worse, he is not sure when last he saw the patient.   ED Course: Temperature down to 92.7, heart rate 48-72.  Due to altered mental status and unresponsive, patient was immediately intubated in the ED, for airway protection. Blood work revealed marked metabolic dysfunction, with creatinine of 21.04, potassium 8.1.  Anion gap 26, serum bicarb 9, lactic acidosis 3.3  > 5.9.  Markedly elevated liver enzymes ALP 1501, total bilirubin of 33.9.  Central and arterial line were placed, patient became hypotensive, was started on Levophed drip. Broad-spectrum antibiotics were started. EDP talked to intensivist on-call at [facility], recommended palliative/comfort care,  Nothing more could be done. ED provider spoke to patient's spouse, and family at bedside, they agreed to palliative and comfort care, but wanted to hold off on terminal extubation and withdrawal of pressors until a family member-grandson arrived.  Multiple organ failure-difficult to tell what the inciting etiology is, but most likely severe sepsis, and  severe dehydration.  Patient was covered in dried black feces and black vomitus on arrival to the ED, she was unresponsive. She presented with severe multiple organ dysfunction including acute renal failure creatinine of 21.05, BUN 316, potassium of 8.1, liver failure, ALP 1501, AST 991, total bilirubin 33.9, encephalopathic, with cardiovascular failure -requiring pressor support with Levophed drip, respiratory failure requiring intubation for airway protection, hematemesis, melena with hemoglobin 7.8 ( down 5 points from baseline  ~ 12)), anion gap metabolic acidosis, with serum bicarb of 9, anion gap of 26, pH 7.0..  Worsening lactic acidosis 3.3 > 5.9 after 4.5 L bolus given. - Family initially wanted everything done, so patient was intubated, central and arterial lines were placed.  Broad-spectrum antibiotics started- with Vanc, cefepime and metronidazole, sepsis protocol was initiated. - EDP talked to intensivist at [facility], recommended comfort/palliative care. -Poor prognosis, multiorgan dysfunction explained to family. Family agreed to comfort care measures, with terminal extubation and weaning off of pressors.  Comfort care orders were placed.  Patient expired peacefully in the emergency department, in the presence of family members.   Abigail Montgomery Abigail Montgomery 03-04-21, 9:36 PM

## 2021-03-05 NOTE — ED Notes (Signed)
Terminal extubation to 4 liter nasal canula

## 2021-03-05 NOTE — ED Notes (Signed)
Date and time results received: 02-24-2021 1705 (use smartphrase ".now" to insert current time)  Test: total bilirubin Critical Value: 33.9  Name of Provider Notified: Dr. Criss Alvine  Orders Received? Or Actions Taken?: see chart

## 2021-03-05 NOTE — ED Notes (Signed)
Pt cleaned up, full body bath, blow up mattress underneath, bair hugger applied on pt.

## 2021-03-05 NOTE — ED Notes (Signed)
Time out done prior to intubation. 53mm ET tube placed at 22 and the lip. Decured with securing device by RT.

## 2021-03-05 NOTE — Progress Notes (Addendum)
Notified provider of need to order repeat lactic acid.   MD responded patient is going to be made comfort care. Sepsis orders will be DC

## 2021-03-05 NOTE — Progress Notes (Signed)
Elink following sepsis 

## 2021-03-05 NOTE — ED Notes (Signed)
Date and time results received: 02-25-21 1633 (use smartphrase ".now" to insert current time)  Test: K+ Critical Value: 8.1  Name of Provider Notified: Dr. Criss Alvine  Orders Received? Or Actions Taken?: see chart

## 2021-03-05 NOTE — ED Notes (Signed)
On the patients husbands request. Levophed stopped and respiratory has been contacted to remove pt from the ventilator.

## 2021-03-05 NOTE — ED Provider Notes (Signed)
Procedure Name: Intubation Date/Time: Mar 03, 2021 5:02 PM Performed by: Pricilla Loveless, MD Pre-anesthesia Checklist: Patient identified, Patient being monitored, Emergency Drugs available, Timeout performed and Suction available Oxygen Delivery Method: Non-rebreather mask Preoxygenation: Pre-oxygenation with 100% oxygen Induction Type: Rapid sequence Ventilation: Mask ventilation without difficulty Laryngoscope Size: Glidescope and 4 Grade View: Grade I Tube size: 8.0 mm Number of attempts: 1 Airway Equipment and Method: Video-laryngoscopy Placement Confirmation: ETT inserted through vocal cords under direct vision, CO2 detector and Breath sounds checked- equal and bilateral Secured at: 26 cm Tube secured with: ETT holder Dental Injury: Teeth and Oropharynx as per pre-operative assessment     .Central Line  Date/Time: 03-03-2021 5:02 PM Performed by: Pricilla Loveless, MD Authorized by: Pricilla Loveless, MD   Consent:    Consent obtained:  Emergent situation Universal protocol:    Immediately prior to procedure, a time out was called: yes     Patient identity confirmed:  Anonymous protocol, patient vented/unresponsive Pre-procedure details:    Indication(s): central venous access, needed after discharge for ongoing care, hemodynamic monitoring and insufficient peripheral access     Hand hygiene: Hand hygiene performed prior to insertion     Sterile barrier technique: All elements of maximal sterile technique followed     Skin preparation:  Chlorhexidine   Skin preparation agent: Skin preparation agent completely dried prior to procedure   Anesthesia:    Anesthesia method:  None Procedure details:    Location:  R internal jugular   Patient position:  Trendelenburg   Procedural supplies:  Triple lumen   Landmarks identified: yes     Ultrasound guidance: yes     Ultrasound guidance timing: real time     Sterile ultrasound techniques: Sterile gel and sterile probe covers were  used     Number of attempts:  1   Successful placement: yes   Post-procedure details:    Post-procedure:  Dressing applied and line sutured   Assessment:  Blood return through all ports, free fluid flow, placement verified by x-ray and no pneumothorax on x-ray   Procedure completion:  Tolerated well, no immediate complications ARTERIAL LINE  Date/Time: 03/03/2021 5:03 PM Performed by: Pricilla Loveless, MD Authorized by: Pricilla Loveless, MD   Consent:    Consent obtained:  Emergent situation Universal protocol:    Patient identity confirmed:  Anonymous protocol, patient vented/unresponsive Indications:    Indications: hemodynamic monitoring   Pre-procedure details:    Skin preparation:  Chlorhexidine Sedation:    Sedation type:  None Anesthesia:    Anesthesia method:  None Procedure details:    Location:  L radial   Placement technique:  Ultrasound guided   Number of attempts:  1   Transducer: waveform confirmed   Post-procedure details:    Post-procedure:  Biopatch applied, sterile dressing applied and sutured   CMS:  Unchanged   Procedure completion:  Tolerated well, no immediate complications    Pricilla Loveless, MD 03/03/21 2333

## 2021-03-05 NOTE — ED Notes (Signed)
Central line previously placed by provider has been removed. Catheter tip in tact.

## 2021-03-05 DEATH — deceased
